# Patient Record
Sex: Male | Born: 1950 | Race: White | Hispanic: No | Marital: Married | Smoking: Never smoker
Health system: Southern US, Community
[De-identification: ages and names within clinical notes are randomized; demographics above are authoritative.]

## PROBLEM LIST (undated history)

## (undated) DIAGNOSIS — I10 Essential (primary) hypertension: Secondary | ICD-10-CM

## (undated) DIAGNOSIS — E785 Hyperlipidemia, unspecified: Secondary | ICD-10-CM

## (undated) DIAGNOSIS — H35039 Hypertensive retinopathy, unspecified eye: Secondary | ICD-10-CM

## (undated) HISTORY — PX: HERNIA REPAIR: SHX51

## (undated) HISTORY — DX: Essential (primary) hypertension: I10

## (undated) HISTORY — DX: Hyperlipidemia, unspecified: E78.5

## (undated) HISTORY — PX: EYE SURGERY: SHX253

## (undated) HISTORY — DX: Hypertensive retinopathy, unspecified eye: H35.039

## (undated) HISTORY — PX: CATARACT EXTRACTION: SUR2

## (undated) HISTORY — PX: RETINAL DETACHMENT SURGERY: SHX105

---
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
## 1997-03-04 HISTORY — PX: CATARACT EXTRACTION: SUR2

## 1998-10-04 ENCOUNTER — Ambulatory Visit: Admission: RE | Admit: 1998-10-04 | Discharge: 1998-10-04 | Payer: Self-pay | Admitting: Internal Medicine

## 2000-07-24 ENCOUNTER — Ambulatory Visit (HOSPITAL_COMMUNITY): Admission: RE | Admit: 2000-07-24 | Discharge: 2000-07-24 | Payer: Self-pay | Admitting: Internal Medicine

## 2003-02-08 ENCOUNTER — Encounter: Admission: RE | Admit: 2003-02-08 | Discharge: 2003-05-09 | Payer: Self-pay | Admitting: Family Medicine

## 2005-06-13 ENCOUNTER — Ambulatory Visit (HOSPITAL_COMMUNITY): Admission: RE | Admit: 2005-06-13 | Discharge: 2005-06-13 | Payer: Self-pay | Admitting: Internal Medicine

## 2005-11-12 ENCOUNTER — Ambulatory Visit (HOSPITAL_COMMUNITY): Admission: RE | Admit: 2005-11-12 | Discharge: 2005-11-13 | Payer: Self-pay | Admitting: Ophthalmology

## 2005-11-22 ENCOUNTER — Ambulatory Visit (HOSPITAL_COMMUNITY): Admission: RE | Admit: 2005-11-22 | Discharge: 2005-11-23 | Payer: Self-pay | Admitting: Ophthalmology

## 2010-02-15 ENCOUNTER — Ambulatory Visit: Payer: Self-pay | Admitting: Cardiology

## 2010-02-19 ENCOUNTER — Telehealth (INDEPENDENT_AMBULATORY_CARE_PROVIDER_SITE_OTHER): Payer: Self-pay | Admitting: *Deleted

## 2010-02-20 ENCOUNTER — Encounter (HOSPITAL_COMMUNITY)
Admission: RE | Admit: 2010-02-20 | Discharge: 2010-04-03 | Payer: Self-pay | Source: Home / Self Care | Attending: Cardiology | Admitting: Cardiology

## 2010-02-20 ENCOUNTER — Encounter: Payer: Self-pay | Admitting: Cardiology

## 2010-02-20 ENCOUNTER — Encounter: Payer: Self-pay | Admitting: *Deleted

## 2010-02-20 ENCOUNTER — Ambulatory Visit: Payer: Self-pay

## 2010-02-21 HISTORY — PX: CARDIOVASCULAR STRESS TEST: SHX262

## 2010-03-09 ENCOUNTER — Ambulatory Visit: Payer: Self-pay | Admitting: Cardiovascular Disease

## 2010-04-05 NOTE — Progress Notes (Signed)
Summary: nuc pre procedure  Phone Note Outgoing Call Call back at Home Phone [phone]   Call placed by: Cathlyn Parsons RN,  February 19, 2010 4:51 PM Call placed to: Patient Summary of Call: Left message with information on Myoview Information Sheet (see scanned document for details).      Nuclear Med Background Indications for Stress Test: Evaluation for Ischemia   History: Myocardial Infarction  History Comments: 09 MPS normal  Symptoms: Chest Pain  Symptoms Comments: Chest pain radiates back and L arm   Nuclear Pre-Procedure Cardiac Risk Factors: Family History - CAD, Hypertension, Lipids, NIDDM

## 2010-04-05 NOTE — Assessment & Plan Note (Signed)
Summary: Cardiology Nuclear Testing  Nuclear Med Background Indications for Stress Test: Evaluation for Ischemia   History: Myocardial Infarction  History Comments: 09 MPS normal  Symptoms: Chest Pain, DOE, Fatigue  Symptoms Comments: Chest pain radiates back and L arm   Nuclear Pre-Procedure Cardiac Risk Factors: Family History - CAD, Hypertension, Lipids, NIDDM Caffeine/Decaff Intake: none NPO After: 7:30 AM Lungs: clear IV 0.9% NS with Angio Cath: 22g     IV Site: R Hand IV Started by: Cathlyn Parsons, RN Chest Size (in) 44     Height (in): 67 Weight (lb): 195 BMI: 30.65  Nuclear Med Study 1 or 2 day study:  1 day     Stress Test Type:  Eugenie Birks Reading MD:  Cassell Clement, MD     Referring MD:  S.Tennant Resting Radionuclide:  Technetium 59m Tetrofosmin     Resting Radionuclide Dose:  11 mCi  Stress Radionuclide:  Technetium 96m Tetrofosmin     Stress Radionuclide Dose:  33 mCi   Stress Protocol  Max Systolic BP: 153 mm Hg Lexiscan: 0.4 mg   Stress Test Technologist:  Milana Na, EMT-P     Nuclear Technologist:  Doyne Keel, CNMT  Rest Procedure  Myocardial perfusion imaging was performed at rest 45 minutes following the intravenous administration of Technetium 63m Tetrofosmin.  Stress Procedure  The patient received IV Lexiscan 0.4 mg over 15-seconds.  Technetium 46m Tetrofosmin injected at 30-seconds.  There were no significant changes with infusion.  Quantitative spect images were obtained after a 45 minute delay.  QPS Raw Data Images:  Normal; no motion artifact; normal heart/lung ratio. Stress Images:  Normal homogeneous uptake in all areas of the myocardium. Rest Images:  Normal homogeneous uptake in all areas of the myocardium. Subtraction (SDS):  No evidence of ischemia. Transient Ischemic Dilatation:  .99  (Normal <1.22)  Lung/Heart Ratio:  .35  (Normal <0.45)  Quantitative Gated Spect Images QGS EDV:  100 ml QGS ESV:  35 ml QGS EF:  65  % QGS cine images:  Normal wall motion.  Findings Normal nuclear study      Overall Impression  Exercise Capacity: Lexiscan with no exercise. BP Response: Normal blood pressure response. Clinical Symptoms: No chest pain ECG Impression: No significant ST segment change suggestive of ischemia. Overall Impression: Normal stress nuclear study.  Appended Document: Cardiology Nuclear Testing copy sent to Dr. Deborah Chalk

## 2010-07-20 ENCOUNTER — Telehealth: Payer: Self-pay | Admitting: Cardiovascular Disease

## 2010-07-20 NOTE — Op Note (Signed)
NAME:  Anthony Brennan, Anthony Brennan NO.:  [id number]   MEDICAL RECORD NO.:  [id number]          PATIENT TYPE:  OIB   LOCATION:  5742                         FACILITY:  [facility]   PHYSICIAN:  Beulah Gandy. Ashley Royalty, M.D. DATE OF BIRTH:  1950-11-21   DATE OF PROCEDURE:  11/22/2005  DATE OF DISCHARGE:  11/23/2005                                 OPERATIVE REPORT   ADMISSION DIAGNOSES:  1. Ocular hyphema.  2. Vitreous hemorrhage.   POSTOPERATIVE DIAGNOSES:  1. Hyphema.  2. Vitreous hemorrhage.  3. Retinal detachment.  4. Subretinal hemorrhage.   PROCEDURES:  Pars plana vitrectomy, anterior chamber washout, removal of  subretinal hemorrhage, Perfluoron injection, Perfluoron removal, silicone  oil injection, membrane peel, iridectomy x2, in the left eye.   SURGEON:  Beulah Gandy. Ashley Royalty, M.D.   ASSISTANT:  Bryan Lemma. Lundquist, P.A.   ANESTHESIA:  General.   DETAILS:  Usual prep and drape, peritomies marked at 10, 2 and 4 o'clock.  The Lewicky infusion device was inserted into the cornea at 6 o'clock.  A  syringe was used to withdraw blood and fluid from the anterior chamber.  This maneuver was continued until the anterior chamber was clear.  The iris  details and the implant became apparent.  The 6-mm infusion port was  anchored into place at 4 o'clock.  The lighted pick and the cutter were  placed at 10 and 2 o'clock, respectively.  Provisc was placed on the corneal  surface.  Pars plana vitrectomy was performed carefully with removal of the  gas bubble just behind the pseudophakos.  Large clots of blood were  encountered and carefully removed, layer by layer, until retinal tissue was  seen.  The retina was thrown into folds, was gray, and had multiple large  breaks.  PVR membranes were attached to the retina.  These were stripped  with vitreous forceps and the lighted pick.  Subretinal clots were removed  with the vitreous cutter.  Perfluoron was injected sequentially to reattach  the retina while blood was removed from the subretinal space.  Anterior loop  traction was relieved with the vitreous cutter, forceps and the vitreous  pick.  Reattachment of the retina was obtained with full Perfluoron  injection.  A gas-fluid exchange was performed and removal of Perfluoron  with infusion of sterile gas.  A vitreous washout was performed with BSS and  all Perfluoron was removed.  Silicone oil was injected to a total fill.  Peripheral iridectomy at 12 o'clock was performed with the vitreous cutter  and at 6 o'clock with the MVR knife.  Gas was infused into the anterior  chamber to force silicone oil behind the pupillary plane.  The instruments  were removed from the eye and 9-0 nylon was used to close the sclerotomy  sites.  They were tested and found to be tight.  The conjunctiva was  reposited with 7-0 chromic suture.  A 10-0 nylon suture was placed in the  cornea at 5 o'clock.  The corneal wound was tight.  Closing pressure was 15  with a Barraquer tonometer.  Polymyxin and  gentamicin were irrigated into  Tenon's space.  Atropine solution was applied.  Marcaine was injected around  the globe for postop pain.  Decadron 10 mg was injected into the lower  subconjunctival space.  TobraDex ophthalmic ointment, a patch and shield  were placed.  The patient was awakened and taken to recovery in satisfactory  condition.   COMPLICATIONS:  None.   DURATION:  2-1/2 hours.      Beulah Gandy. Ashley Royalty, M.D.  Electronically Signed     JDM/MEDQ  D:  11/22/2005  T:  11/25/2005  Job:  [id number]

## 2010-07-20 NOTE — Telephone Encounter (Signed)
PT ASKING FOR SAMPLES OF ACTOS. CAN LEAVE MESG ON MACHINE AT HOME IF NEED TO. CHART IN BOX.

## 2010-07-20 NOTE — Telephone Encounter (Signed)
msg left, we have actos 30mg  one moths worth [id number] may 2013,Jodette [facility] RN

## 2010-07-20 NOTE — Op Note (Signed)
NAME:  MARTINE, TRAGESER NO.:  [id number]   MEDICAL RECORD NO.:  [id number]          PATIENT TYPE:  OIB   LOCATION:  5730                         FACILITY:  [facility]   PHYSICIAN:  Beulah Gandy. Ashley Royalty, M.D. DATE OF BIRTH:  1951-01-03   DATE OF PROCEDURE:  11/12/2005  DATE OF DISCHARGE:  11/13/2005                                 OPERATIVE REPORT   ADMISSION DIAGNOSIS:  Vitreous hemorrhage, proliferative diabetic  retinopathy left eye.  That is the preop diagnosis.   POSTOPERATIVE DIAGNOSIS:  Proliferative diabetic retinopathy, retinal  detachment, vitreous hemorrhage, choroidal hemorrhage, ocular hemorrhage, in  the left eye.   PROCEDURES:  Pars plana vitrectomy with membrane peel, retinal  photocoagulation, gas fluid exchange, scleral buckle, intraocular laser, use  of iris retractors.   SURGEON:  Alan Mulder, M.D.   ASSISTANT:  Adela Ports RN for the first portion and Alma Downs PA-C for  the last portion of the procedure.   ANESTHESIA:  General.   DETAILS:  Usual prep and drape, 25 gauge trocars placed at 10, 2 and 4  o'clock.  Provisc placed on the corneal surface.  A small pupil was seen.  The pars plana vitrectomy was begun just behind the pseudophakos.  Vitreous  and blood were encountered, carefully removed.  A large clump of blood was  seen in the lower half of the retina.  This blood was removed carefully and  slowly until white retina was seen.  As the blood was removed, a retinal  detachment was revealed.  AM small retinal horseshoe tear was seen at 7  o'clock at the equator causing the detachment.  The detachment extended from  4 o'clock to 9 o'clock and extended to the disk.  The vitrectomy continued  removing all vitreous and blood along with some vitreous membranes.  The  endolaser was positioned in the eye.  2781 burns were placed around the  retinal periphery in pan retinal fashion, power was between 1000 and 1500  milliwatts, 1000 microns  each and 0.1 seconds each.  The decision was made  at this point for scleral buckle. The 25 gauge trocars were removed and the  wounds were held until tight. Notification of the family was made.  At this  point a 360 degrees scleral dissection was carried out.  The rectus muscles  were imbricated with 4-0 silk sutures.  Diathermy was placed in the bed.  Additional posterior dissection was carried out at 7 o'clock to support the  retinal break.  The scleral buckle was inserted and scleral flaps were  closed with Mersilene suture.  The band was adjusted and trimmed.  The  buckle was adjusted and trimmed.  The sutures were knotted and trimmed.  25  gauge trocars were repositioned in the eye and vitrectomy was continued.  Perfluoron was infused into the vitreous cavity to reattach the retina.  The  endolaser was positioned in the eye and the completion of the pan retinal  photocoagulation was carried out in the quadrant where the detachment had  previously been and on the scleral buckle.  The break was  well supported on  the buckle.  At this time fresh blood was present in the vitreous cavity and  the view was reduced because of the walls of the eye coming together in  toward the vitreous cavity.  The infusion pressure was immediately increased  to 60 mmHg to stop this hemorrhage, however it continued.  Decision was then  made for 20 gauge vitrectomy a 6-mm infusion port was inserted and pressure  was maintained at 60, a sclerotomy point was made at 2 o'clock in order to  insert 20-gauge cutter and the 20-gauge instruments.  The vitreous cavity  was rinsed and rinsed.  Blood was covering the anterior portion of the  vitreous and the view became very poor as the pupil became small.  Iris  retractors were then used to retract the iris in a square fashion and the  wideness of the pupil improved, however the clarity of the view was still  poor because of continuous bleeding in front of and behind  the implant.  Multiple gas fluid exchange and BSS rinses were performed to remove blood.  However, as each layer of blood was removed, additional bleeding occurred.  This procedure continued for several hours.  Once it was determined that the  view could not be improved a subtotal gas fluid exchange was carried out  with Perfluoropropane 16%.  The instruments were carefully removed and 9-0  nylon was used to close the sclerotomy sites at 2 o'clock and 4 o'clock  where 20-gauge instruments were removed.  The Perfluoropropane was  supplemented in the vitreous cavity to obtain a closing pressure at 10 mmHg  with a Barraquer tonometer.  Scleral buckle was inspected.  The conjunctiva  was reposited with 7-0 chromic sutures.  The iris retractors were removed  and the wounds were found to be tight.  Polymyxin and gentamicin were  irrigated into Tenon's space.  Atropine solution was applied.  Marcaine was  injected around the globe for postop pain.  TobraDex ophthalmic ointment, a  patch and shield were placed.  The patient is awakened and taken to recovery  in satisfactory condition.      Beulah Gandy. Ashley Royalty, M.D.  Electronically Signed     JDM/MEDQ  D:  11/12/2005  T:  11/13/2005  Job:  [id number]

## 2010-09-19 ENCOUNTER — Ambulatory Visit: Payer: Self-pay | Admitting: Nurse Practitioner

## 2010-09-25 ENCOUNTER — Encounter: Payer: Self-pay | Admitting: Cardiovascular Disease

## 2010-09-26 ENCOUNTER — Encounter: Payer: Self-pay | Admitting: Cardiovascular Disease

## 2010-09-27 ENCOUNTER — Encounter: Payer: Self-pay | Admitting: Cardiovascular Disease

## 2010-09-27 ENCOUNTER — Ambulatory Visit (INDEPENDENT_AMBULATORY_CARE_PROVIDER_SITE_OTHER): Payer: BC Managed Care – PPO | Admitting: Cardiovascular Disease

## 2010-09-27 DIAGNOSIS — I10 Essential (primary) hypertension: Secondary | ICD-10-CM

## 2010-09-27 DIAGNOSIS — R079 Chest pain, unspecified: Secondary | ICD-10-CM

## 2010-09-27 DIAGNOSIS — E785 Hyperlipidemia, unspecified: Secondary | ICD-10-CM | POA: Insufficient documentation

## 2010-09-27 NOTE — Progress Notes (Signed)
Anthony Brennan Date of Birth  October 02, 1950 [facility] [address]     [address] [place], [place]  [address] [phone]  Fax  [phone]  History of Present Illness:  Anthony Brennan is a middle-aged gentleman with a history of hypertension and hyperlipidemia. He also has a history of diabetes mellitus. He's had some remote episodes of chest pain and has had a negative stress Myoview study in December of 2011. He's been on a good diet and has been going to [employer]. He's lost about 12 or 15 pounds since I last saw him.   Current Outpatient Prescriptions on File Prior to Visit  Medication Sig Dispense Refill  . aspirin 81 MG tablet Take 81 mg by mouth daily.        . Cholecalciferol (VITAMIN D PO) Take by mouth.        . fish oil-omega-3 fatty acids 1000 MG capsule Take 1,000 mg by mouth 4 (four) times daily.        . metFORMIN (GLUCOPHAGE) 500 MG tablet Take 500 mg by mouth daily.        . niacin 500 MG tablet Take 500 mg by mouth at bedtime.        . pioglitazone (ACTOS) 15 MG tablet Take 30 mg by mouth daily.        . ramipril (ALTACE) 10 MG tablet Take 10 mg by mouth daily.        . rosuvastatin (CRESTOR) 20 MG tablet Take 10 mg by mouth daily.         Allergies  Allergen Reactions  . Other     Steroid eyedrop    Past Medical History  Diagnosis Date  . Hypertension   . Hyperlipidemia   . Diabetes mellitus   . Chest pain     Recent episode of - stress Myoview study was normal    Past Surgical History  Procedure Date  . Cardiovascular stress test 02-21-2010    EF 65%  . Hernia repair 60 years old  . Appendectomy 1970  . Cataract extraction 1999    Left Eye    History  Smoking status  . Never Smoker   Smokeless tobacco  . Not on file    History  Alcohol Use  . Yes    Only Socially    Family History  Problem Relation Age of Onset  . Hypertension Mother   . Hyperlipidemia Brother     Reviw of Systems:  Reviewed  in the HPI.  All other systems are negative.  Physical Exam: BP 124/74  Pulse 80  Ht 5\' 7"  (1.702 m)  Wt 187 lb 3.2 oz (84.913 kg)  BMI 29.32 kg/m2 The patient is alert and oriented x 3.  The mood and affect are normal.   Skin: warm and dry.  Color is normal.    HEENT:   the sclera are nonicteric.  The mucous membranes are moist.  The carotids are 2+ without bruits.  There is no thyromegaly.  There is no JVD.    Lungs: clear.  The chest wall is non tender.    Heart: regular rate with a normal S1 and S2.  There are no murmurs, gallops, or rubs. The PMI is not displaced.     Abdomen: good bowel sounds.  There is no guarding or rebound.  There is no hepatosplenomegaly or tenderness.  There are no masses.   Extremities:  no clubbing, cyanosis, or edema.  The legs  are without rashes.  The distal pulses are intact.   Neuro:  Cranial nerves II - XII are intact.  Motor and sensory functions are intact.    The gait is normal.  ECG:  Assessment / Plan:

## 2010-09-27 NOTE — Assessment & Plan Note (Signed)
He has not had any recurrent episodes of chest pain. He had a Myoview study last year that was normal.

## 2010-09-27 NOTE — Assessment & Plan Note (Signed)
His blood pressure remains well controlled. We'll continue with the same medications.

## 2010-09-27 NOTE — Assessment & Plan Note (Signed)
Significant in 6 months. We'll check fasting lipid profile at that time.

## 2010-12-19 ENCOUNTER — Encounter (INDEPENDENT_AMBULATORY_CARE_PROVIDER_SITE_OTHER): Payer: BC Managed Care – PPO | Admitting: Ophthalmology

## 2010-12-19 DIAGNOSIS — H33309 Unspecified retinal break, unspecified eye: Secondary | ICD-10-CM

## 2010-12-19 DIAGNOSIS — H33009 Unspecified retinal detachment with retinal break, unspecified eye: Secondary | ICD-10-CM

## 2010-12-19 DIAGNOSIS — H18429 Band keratopathy, unspecified eye: Secondary | ICD-10-CM

## 2011-03-25 ENCOUNTER — Ambulatory Visit: Payer: BC Managed Care – PPO | Admitting: Cardiovascular Disease

## 2011-03-25 ENCOUNTER — Ambulatory Visit (INDEPENDENT_AMBULATORY_CARE_PROVIDER_SITE_OTHER): Payer: BC Managed Care – PPO | Admitting: Cardiovascular Disease

## 2011-03-25 ENCOUNTER — Encounter: Payer: Self-pay | Admitting: Cardiovascular Disease

## 2011-03-25 DIAGNOSIS — I10 Essential (primary) hypertension: Secondary | ICD-10-CM

## 2011-03-25 DIAGNOSIS — E785 Hyperlipidemia, unspecified: Secondary | ICD-10-CM

## 2011-03-25 DIAGNOSIS — R079 Chest pain, unspecified: Secondary | ICD-10-CM

## 2011-03-25 MED ORDER — RAMIPRIL 10 MG PO TABS: 10.0000 mg | ORAL_TABLET | Freq: Two times a day (BID) | ORAL | Status: DC

## 2011-03-25 MED ORDER — ROSUVASTATIN CALCIUM 20 MG PO TABS: 10.0000 mg | ORAL_TABLET | Freq: Every day | ORAL | Status: DC

## 2011-03-25 NOTE — Assessment & Plan Note (Signed)
Doing well.  Continue Altace

## 2011-03-25 NOTE — Assessment & Plan Note (Signed)
Will refill his Crestor.  Dr. Wylene Simmer will be checking lipids Wednesday.

## 2011-03-25 NOTE — Patient Instructions (Signed)
The current medical regimen is effective;  continue present plan and medications.  Follow up in 6 months with Dr Elease Hashimoto.  You will receive a letter in the mail 2 months before you are due.  Please call us when you receive this letter to schedule your follow up appointment.  Your physician recommends that you return for lab work in: 6 months for fasting lipid, hepatic panel and basic metabolic panel.

## 2011-03-25 NOTE — Progress Notes (Signed)
    Anthony Brennan Date of Birth  04-25-1950 [facility]     [place] Office  [address]    [address]   [address] [place], [place]  [address]    [place], [place]  [address] [phone]  Fax  [phone]  [phone]  Fax [phone]   History of Present Illness:  Anthony Brennan is a middle age gentleman with a hx of HTN and hyperlipidemia, DM.  He has done well .  Current Outpatient Prescriptions on File Prior to Visit  Medication Sig Dispense Refill  . aspirin 81 MG tablet Take 81 mg by mouth daily.        . Cholecalciferol (VITAMIN D PO) Take by mouth.        . fish oil-omega-3 fatty acids 1000 MG capsule Take 1,000 mg by mouth 4 (four) times daily.        . metFORMIN (GLUCOPHAGE) 500 MG tablet Take 500 mg by mouth daily.        Marland Kitchen moxifloxacin (VIGAMOX) 0.5 % ophthalmic solution Place 1 drop into the left eye 2 (two) times daily.        . niacin 500 MG tablet Take 500 mg by mouth 2 (two) times daily with a meal.       . pioglitazone (ACTOS) 15 MG tablet Take 30 mg by mouth daily.        . ramipril (ALTACE) 10 MG tablet Take 10 mg by mouth daily.        . rosuvastatin (CRESTOR) 20 MG tablet Take 10 mg by mouth daily.         Allergies  Allergen Reactions  . Other     Steroid eyedrop    Past Medical History  Diagnosis Date  . Hypertension   . Hyperlipidemia   . Diabetes mellitus   . Chest pain     Recent episode of - stress Myoview study was normal    Past Surgical History  Procedure Date  . Cardiovascular stress test 02-21-2010    EF 65%  . Hernia repair 61 years old  . Appendectomy 1970  . Cataract extraction 1999    Left Eye    History  Smoking status  . Never Smoker   Smokeless tobacco  . Not on file    History  Alcohol Use  . Yes    Only Socially    Family History  Problem Relation Age of Onset  . Hypertension Mother   . Hyperlipidemia Brother     Reviw of Systems:  Reviewed in the HPI.  All other systems are  negative.  Physical Exam: BP 139/75  Pulse 75  Ht 5\' 7"  (1.702 m)  Wt 181 lb 1.9 oz (82.155 kg)  BMI 28.37 kg/m2 The patient is alert and oriented x 3.  The mood and affect are normal.   Skin: warm and dry.  Color is normal.    HEENT:   His carotids are normal. There is no JVD. His neck is supple.  Lungs: Lungs are clear.   Heart: Regular rate S1-S2.    Abdomen: Has good bowel sounds.  Extremities:  No clubbing cyanosis or edema.  Neuro:  The exam is nonfocal.    ECG: Normal sinus rhythm. Normal EKG.  Assessment / Plan:

## 2011-10-03 ENCOUNTER — Encounter (INDEPENDENT_AMBULATORY_CARE_PROVIDER_SITE_OTHER): Payer: BC Managed Care – PPO | Admitting: Ophthalmology

## 2011-10-03 DIAGNOSIS — H33309 Unspecified retinal break, unspecified eye: Secondary | ICD-10-CM

## 2011-10-03 DIAGNOSIS — H33009 Unspecified retinal detachment with retinal break, unspecified eye: Secondary | ICD-10-CM

## 2011-10-03 DIAGNOSIS — H43819 Vitreous degeneration, unspecified eye: Secondary | ICD-10-CM

## 2012-03-30 ENCOUNTER — Encounter: Payer: Self-pay | Admitting: Cardiovascular Disease

## 2012-06-02 ENCOUNTER — Ambulatory Visit (INDEPENDENT_AMBULATORY_CARE_PROVIDER_SITE_OTHER): Payer: BC Managed Care – PPO | Admitting: Cardiovascular Disease

## 2012-06-02 ENCOUNTER — Encounter: Payer: Self-pay | Admitting: Cardiovascular Disease

## 2012-06-02 VITALS — BP 142/82 | HR 76 | Ht 67.0 in | Wt 208.0 lb

## 2012-06-02 DIAGNOSIS — E785 Hyperlipidemia, unspecified: Secondary | ICD-10-CM

## 2012-06-02 DIAGNOSIS — I10 Essential (primary) hypertension: Secondary | ICD-10-CM

## 2012-06-02 NOTE — Assessment & Plan Note (Signed)
Anthony Brennan has gained a little weight over the past year. He notes that he needs to get back to working out. At this point I don't think the facial is helping him so we will discontinue it. I've encouraged him to work out on a regular basis. I'll see him again in one year for followup visit.

## 2012-06-02 NOTE — Progress Notes (Signed)
    Anthony Brennan Date of Birth  Mar 24, 1950 [facility]     [place] Office  [address]    [address]   [address] [place], [place]  [address]    [place], [place]  [address] [phone]  Fax  [phone]  [phone]  Fax [phone]  Problem List: 1. HTN 2. Hyperlipidemia   History of Present Illness:  Anthony Brennan is a middle age gentleman with a hx of HTN and hyperlipidemia, DM.  He has done well .  June 02, 2012:  Anthony Brennan is doing well.  No CP or dyspnea.  Several dizzy spells yesterday - one episode occurred while he was sitting at his desk .  He has just started walking again this spring.    Current Outpatient Prescriptions on File Prior to Visit  Medication Sig Dispense Refill  . aspirin 81 MG tablet Take 81 mg by mouth daily.        . Cholecalciferol (VITAMIN D PO) Take by mouth.        . fish oil-omega-3 fatty acids 1000 MG capsule Take 1,000 mg by mouth 4 (four) times daily.        . metFORMIN (GLUCOPHAGE) 500 MG tablet Take 500 mg by mouth daily.        . pioglitazone (ACTOS) 15 MG tablet Take 30 mg by mouth daily.        . ramipril (ALTACE) 10 MG tablet Take 1 tablet (10 mg total) by mouth 2 (two) times daily.  180 tablet  3  . rosuvastatin (CRESTOR) 20 MG tablet Take 0.5 tablets (10 mg total) by mouth daily.  30 tablet  6   No current facility-administered medications on file prior to visit.    Allergies  Allergen Reactions  . Other     Steroid eyedrop    Past Medical History  Diagnosis Date  . Hypertension   . Hyperlipidemia   . Diabetes mellitus   . Chest pain     Recent episode of - stress Myoview study was normal    Past Surgical History  Procedure Laterality Date  . Cardiovascular stress test  02-21-2010    EF 65%  . Hernia repair  62 years old  . Appendectomy  1970  . Cataract extraction  1999    Left Eye    History  Smoking status  . Never Smoker   Smokeless tobacco  . Not on file    History  Alcohol Use  . Yes   Comment: Only Socially    Family History  Problem Relation Age of Onset  . Hypertension Mother   . Hyperlipidemia Brother     Reviw of Systems:  Reviewed in the HPI.  All other systems are negative.  Physical Exam: BP 142/82  Pulse 76  Ht 5\' 7"  (1.702 m)  Wt 208 lb (94.348 kg)  BMI 32.57 kg/m2 The patient is alert and oriented x 3.  The mood and affect are normal.   Skin: warm and dry.  Color is normal.    HEENT:   His carotids are normal. There is no JVD. His neck is supple.  Lungs: Lungs are clear.   Heart: Regular rate S1-S2.    Abdomen: Has good bowel sounds.  Extremities:  No clubbing cyanosis or edema.  2+ pulses   Neuro:  The exam is nonfocal.    ECG: June 02, 2012:  Normal sinus rhythm at 76. Normal EKG.  Assessment / Plan:

## 2012-06-02 NOTE — Patient Instructions (Addendum)
Your physician wants you to follow-up in: 1 YEAR  You will receive a reminder letter in the mail two months in advance. If you don't receive a letter, please call our office to schedule the follow-up appointment.   Your physician recommends that you continue on your current medications as directed. Please refer to the Current Medication list given to you today.   Your physician has recommended you make the following change in your medication:   STOP FISH OIL DUE TO INEFFECTIVENESS

## 2012-06-02 NOTE — Assessment & Plan Note (Signed)
Stable

## 2012-06-03 ENCOUNTER — Encounter: Payer: Self-pay | Admitting: Cardiovascular Disease

## 2012-06-03 ENCOUNTER — Encounter: Payer: Self-pay | Admitting: Cardiology

## 2012-07-02 ENCOUNTER — Ambulatory Visit (INDEPENDENT_AMBULATORY_CARE_PROVIDER_SITE_OTHER): Payer: BC Managed Care – PPO | Admitting: Ophthalmology

## 2012-07-08 ENCOUNTER — Ambulatory Visit (INDEPENDENT_AMBULATORY_CARE_PROVIDER_SITE_OTHER): Payer: BC Managed Care – PPO | Admitting: Ophthalmology

## 2012-07-08 DIAGNOSIS — H43819 Vitreous degeneration, unspecified eye: Secondary | ICD-10-CM

## 2012-07-08 DIAGNOSIS — E11319 Type 2 diabetes mellitus with unspecified diabetic retinopathy without macular edema: Secondary | ICD-10-CM

## 2012-07-08 DIAGNOSIS — H33009 Unspecified retinal detachment with retinal break, unspecified eye: Secondary | ICD-10-CM

## 2012-07-08 DIAGNOSIS — H33309 Unspecified retinal break, unspecified eye: Secondary | ICD-10-CM

## 2013-04-13 ENCOUNTER — Ambulatory Visit (INDEPENDENT_AMBULATORY_CARE_PROVIDER_SITE_OTHER): Payer: BC Managed Care – PPO | Admitting: Ophthalmology

## 2013-04-13 DIAGNOSIS — H35039 Hypertensive retinopathy, unspecified eye: Secondary | ICD-10-CM

## 2013-04-13 DIAGNOSIS — I10 Essential (primary) hypertension: Secondary | ICD-10-CM

## 2013-04-13 DIAGNOSIS — H33309 Unspecified retinal break, unspecified eye: Secondary | ICD-10-CM

## 2013-04-13 DIAGNOSIS — H43819 Vitreous degeneration, unspecified eye: Secondary | ICD-10-CM

## 2013-04-13 DIAGNOSIS — H33009 Unspecified retinal detachment with retinal break, unspecified eye: Secondary | ICD-10-CM

## 2013-06-30 ENCOUNTER — Ambulatory Visit (INDEPENDENT_AMBULATORY_CARE_PROVIDER_SITE_OTHER): Payer: BC Managed Care – PPO | Admitting: Cardiovascular Disease

## 2013-06-30 ENCOUNTER — Encounter: Payer: Self-pay | Admitting: Cardiovascular Disease

## 2013-06-30 VITALS — BP 144/72 | HR 92 | Ht 67.0 in | Wt 208.0 lb

## 2013-06-30 DIAGNOSIS — R0789 Other chest pain: Secondary | ICD-10-CM

## 2013-06-30 DIAGNOSIS — E785 Hyperlipidemia, unspecified: Secondary | ICD-10-CM

## 2013-06-30 DIAGNOSIS — I10 Essential (primary) hypertension: Secondary | ICD-10-CM

## 2013-06-30 DIAGNOSIS — E119 Type 2 diabetes mellitus without complications: Secondary | ICD-10-CM

## 2013-06-30 NOTE — Patient Instructions (Signed)
Your physician recommends that you continue on your current medications as directed. Please refer to the Current Medication list given to you today.  Your physician wants you to follow-up in: 1 year with Dr. Nahser.  You will receive a reminder letter in the mail two months in advance. If you don't receive a letter, please call our office to schedule the follow-up appointment.  

## 2013-06-30 NOTE — Assessment & Plan Note (Signed)
Anthony Brennan seems to be doing well. His blood pressures fairly well controlled. He's to get out and exercise more. Is really slacked off on his walking regimen this year. I've encouraged him to start a routine walking program. This will help his hypertension, hyperlipidemia, and his diabetes.   I'll see him in 1 year.    Dr. Osborne Casco manages his lipids.

## 2013-06-30 NOTE — Progress Notes (Signed)
Anthony Brennan Date of Birth  [birth year] [place]  [address]    [place]   [place], [place]  [address]    [place], [place]  [address] [phone]  Fax  [phone]  [phone]  Fax [phone]  Problem List: 1. HTN 2. Hyperlipidemia 3. Diabetes mellitus   History of Present Illness:  Anthony Brennan is a middle age gentleman with a hx of HTN and hyperlipidemia, DM.  He has done well .  June 02, 2012:  Anthony Brennan is doing well.  No CP or dyspnea.  Several dizzy spells yesterday - one episode occurred while he was sitting at his desk .  He has just started walking again this spring.    June 30, 2013:  Anthony Brennan is doing well.  No complaints.   No exercising much.  Gets winded on occasion when he exercises.  He gets his labs at Dr. Loren Racer office.  His trigs , HbA1C was elevated.     Current Outpatient Prescriptions on File Prior to Visit  Medication Sig Dispense Refill  . aspirin 81 MG tablet Take 81 mg by mouth daily.        . Cholecalciferol (VITAMIN D PO) Take by mouth.        . metFORMIN (GLUCOPHAGE) 500 MG tablet Take 500 mg by mouth daily.        . pioglitazone (ACTOS) 15 MG tablet Take 30 mg by mouth daily.        . ramipril (ALTACE) 10 MG tablet Take 1 tablet (10 mg total) by mouth 2 (two) times daily.  180 tablet  3  . rosuvastatin (CRESTOR) 20 MG tablet Take 0.5 tablets (10 mg total) by mouth daily.  30 tablet  6   No current facility-administered medications on file prior to visit.    Allergies  Allergen Reactions  . Other     Steroid eyedrop    Past Medical History  Diagnosis Date  . Hypertension   . Hyperlipidemia   . Diabetes mellitus   . Chest pain     Recent episode of - stress Myoview study was normal    Past Surgical History  Procedure Laterality Date  . Cardiovascular stress test  02-21-2010    EF 65%  . Hernia repair  63 years old  . Appendectomy  1970  . Cataract extraction  1999    Left Eye     History  Smoking status  . Never Smoker   Smokeless tobacco  . Not on file    History  Alcohol Use  . Yes    Comment: Only Socially    Family History  Problem Relation Age of Onset  . Hypertension Mother   . Hyperlipidemia Brother     Reviw of Systems:  Reviewed in the HPI.  All other systems are negative.  Physical Exam: BP 144/72  Pulse 92  Ht 5\' 7"  (1.702 m)  Wt 208 lb (94.348 kg)  BMI 32.57 kg/m2 The patient is alert and oriented x 3.  The mood and affect are normal.   Skin: warm and dry.  Color is normal.    HEENT:   His carotids are normal. There is no JVD. His neck is supple.  Lungs: Lungs are clear.   Heart: Regular rate S1-S2.    Abdomen: Has good bowel sounds.  Extremities:  No clubbing cyanosis or edema.  2+ pulses   Neuro:  The exam is nonfocal.  ECG: June 30, 2013:   NSR at 92. Normal ECG.   Assessment / Plan:

## 2014-01-11 ENCOUNTER — Ambulatory Visit (INDEPENDENT_AMBULATORY_CARE_PROVIDER_SITE_OTHER): Payer: BC Managed Care – PPO | Admitting: Ophthalmology

## 2014-01-11 DIAGNOSIS — H338 Other retinal detachments: Secondary | ICD-10-CM

## 2014-01-11 DIAGNOSIS — E11319 Type 2 diabetes mellitus with unspecified diabetic retinopathy without macular edema: Secondary | ICD-10-CM

## 2014-01-11 DIAGNOSIS — H35031 Hypertensive retinopathy, right eye: Secondary | ICD-10-CM

## 2014-01-11 DIAGNOSIS — E11329 Type 2 diabetes mellitus with mild nonproliferative diabetic retinopathy without macular edema: Secondary | ICD-10-CM

## 2014-01-11 DIAGNOSIS — H43811 Vitreous degeneration, right eye: Secondary | ICD-10-CM

## 2014-01-11 DIAGNOSIS — I10 Essential (primary) hypertension: Secondary | ICD-10-CM

## 2014-07-26 NOTE — Progress Notes (Signed)
Cardiology Office Note   Date:  07/26/2014   ID:  Anthony Brennan, DOB 1950/06/03, MRN [id number]  PCP:  Haywood Pao, MD  Cardiologist:   Thayer Headings, MD   Chief Complaint  Patient presents with  . Follow-up    htn   Problem List:   1. HTN 2. Hyperlipidemia   History of Present Illness:  Anthony Brennan is a middle age gentleman with a hx of HTN and hyperlipidemia, DM. He has done well .  June 02, 2012:  Anthony Brennan is doing well. No CP or dyspnea. Several dizzy spells yesterday - one episode occurred while he was sitting at his desk . He has just started walking again this spring.    Jul 27, 2014 : Anthony Brennan is a 64 y.o. male who presents for his HTN. Walking regularly.  No CP or dyspnea     Past Medical History  Diagnosis Date  . Hypertension   . Hyperlipidemia   . Diabetes mellitus   . Chest pain     Recent episode of - stress Myoview study was normal    Past Surgical History  Procedure Laterality Date  . Cardiovascular stress test  02-21-2010    EF 65%  . Hernia repair  64 years old  . Appendectomy  1970  . Cataract extraction  1999    Left Eye     Current Outpatient Prescriptions  Medication Sig Dispense Refill  . aspirin 81 MG tablet Take 81 mg by mouth daily.      . Cholecalciferol (VITAMIN D PO) Take by mouth.      . metFORMIN (GLUCOPHAGE) 500 MG tablet Take 500 mg by mouth daily.      . pioglitazone (ACTOS) 15 MG tablet Take 30 mg by mouth daily.      . ramipril (ALTACE) 10 MG tablet Take 1 tablet (10 mg total) by mouth 2 (two) times daily. 180 tablet 3  . rosuvastatin (CRESTOR) 20 MG tablet Take 0.5 tablets (10 mg total) by mouth daily. 30 tablet 6   No current facility-administered medications for this visit.    Allergies:   Other    Social History:  The patient  reports that he has never smoked. He does not have any smokeless tobacco history on file. He reports that he drinks alcohol.   Family History:  The patient's family history  includes Hyperlipidemia in his brother; Hypertension in his mother.    ROS:  Please see the history of present illness.    Review of Systems: Constitutional:  denies fever, chills, diaphoresis, appetite change and fatigue.  HEENT: denies photophobia, eye pain, redness, hearing loss, ear pain, congestion, sore throat, rhinorrhea, sneezing, neck pain, neck stiffness and tinnitus.  Respiratory: denies SOB, DOE, cough, chest tightness, and wheezing.  Cardiovascular: denies chest pain, palpitations and leg swelling.  Gastrointestinal: denies nausea, vomiting, abdominal pain, diarrhea, constipation, blood in stool.  Genitourinary: denies dysuria, urgency, frequency, hematuria, flank pain and difficulty urinating.  Musculoskeletal: denies  myalgias, back pain, joint swelling, arthralgias and gait problem.   Skin: denies pallor, rash and wound.  Neurological: denies dizziness, seizures, syncope, weakness, light-headedness, numbness and headaches.   Hematological: denies adenopathy, easy bruising, personal or family bleeding history.  Psychiatric/ Behavioral: denies suicidal ideation, mood changes, confusion, nervousness, sleep disturbance and agitation.       All other systems are reviewed and negative.    PHYSICAL EXAM: VS:  There were no vitals taken for this visit. , BMI There is no weight  on file to calculate BMI. GEN: Well nourished, well developed, in no acute distress HEENT: normal Neck: no JVD, carotid bruits, or masses Cardiac: RRR; no murmurs, rubs, or gallops,no edema  Respiratory:  clear to auscultation bilaterally, normal work of breathing GI: soft, nontender, nondistended, + BS MS: no deformity or atrophy Skin: warm and dry, no rash Neuro:  Strength and sensation are intact Psych: normal   EKG:  EKG is ordered today. The ekg ordered today demonstrates :  NSR at 72.  No ST or T wave changes.    Recent Labs: No results found for requested labs within last 365 days.     Lipid Panel No results found for: CHOL, TRIG, HDL, CHOLHDL, VLDL, LDLCALC, LDLDIRECT    Wt Readings from Last 3 Encounters:  06/30/13 94.348 kg (208 lb)  06/02/12 94.348 kg (208 lb)  03/25/11 82.155 kg (181 lb 1.9 oz)      Other studies Reviewed: Additional studies/ records that were reviewed today include: . Review of the above records demonstrates:    ASSESSMENT AND PLAN:  1. HTN- BP is well controlledl.   2. Hyperlipidemia - managed by Dr. Osborne Casco.  , well controlled. Continue crestor    Current medicines are reviewed at length with the patient today.  The patient does not have concerns regarding medicines.  The following changes have been made:  no change  Labs/ tests ordered today include:  No orders of the defined types were placed in this encounter.     Disposition:   FU with me in 1 year      Kenan Moodie, Wonda Cheng, MD  07/26/2014 6:12 PM    [facility] [place], [place], [place]  [address] Phone: [phone]; Fax: [phone]   [facility]  [address] [place], [place]  [address] [phone]    Fax [phone]

## 2014-07-27 ENCOUNTER — Encounter: Payer: Self-pay | Admitting: Cardiovascular Disease

## 2014-07-27 ENCOUNTER — Ambulatory Visit (INDEPENDENT_AMBULATORY_CARE_PROVIDER_SITE_OTHER): Payer: BLUE CROSS/BLUE SHIELD | Admitting: Cardiovascular Disease

## 2014-07-27 VITALS — BP 142/74 | HR 72 | Ht 67.0 in | Wt 181.4 lb

## 2014-07-27 DIAGNOSIS — E785 Hyperlipidemia, unspecified: Secondary | ICD-10-CM | POA: Diagnosis not present

## 2014-07-27 DIAGNOSIS — I10 Essential (primary) hypertension: Secondary | ICD-10-CM | POA: Diagnosis not present

## 2014-07-27 NOTE — Patient Instructions (Signed)
Medication Instructions:  Your physician recommends that you continue on your current medications as directed. Please refer to the Current Medication list given to you today.   Labwork: None Ordered   Testing/Procedures: None Ordered   Follow-Up: Your physician wants you to follow-up in: 1 year with Dr. Nahser.  You will receive a reminder letter in the mail two months in advance. If you don't receive a letter, please call our office to schedule the follow-up appointment.      

## 2014-10-26 ENCOUNTER — Ambulatory Visit (INDEPENDENT_AMBULATORY_CARE_PROVIDER_SITE_OTHER): Payer: BLUE CROSS/BLUE SHIELD | Admitting: Ophthalmology

## 2014-10-26 DIAGNOSIS — H338 Other retinal detachments: Secondary | ICD-10-CM

## 2014-10-26 DIAGNOSIS — H33301 Unspecified retinal break, right eye: Secondary | ICD-10-CM | POA: Diagnosis not present

## 2014-10-26 DIAGNOSIS — H43811 Vitreous degeneration, right eye: Secondary | ICD-10-CM

## 2014-10-26 DIAGNOSIS — H35031 Hypertensive retinopathy, right eye: Secondary | ICD-10-CM

## 2014-10-26 DIAGNOSIS — I10 Essential (primary) hypertension: Secondary | ICD-10-CM

## 2015-07-05 ENCOUNTER — Ambulatory Visit (INDEPENDENT_AMBULATORY_CARE_PROVIDER_SITE_OTHER): Payer: BLUE CROSS/BLUE SHIELD | Admitting: Ophthalmology

## 2015-07-05 DIAGNOSIS — H338 Other retinal detachments: Secondary | ICD-10-CM | POA: Diagnosis not present

## 2015-07-05 DIAGNOSIS — H33301 Unspecified retinal break, right eye: Secondary | ICD-10-CM

## 2015-07-05 DIAGNOSIS — I10 Essential (primary) hypertension: Secondary | ICD-10-CM

## 2015-07-05 DIAGNOSIS — H43811 Vitreous degeneration, right eye: Secondary | ICD-10-CM

## 2015-07-05 DIAGNOSIS — H35033 Hypertensive retinopathy, bilateral: Secondary | ICD-10-CM | POA: Diagnosis not present

## 2015-07-19 ENCOUNTER — Ambulatory Visit (INDEPENDENT_AMBULATORY_CARE_PROVIDER_SITE_OTHER): Payer: BLUE CROSS/BLUE SHIELD | Admitting: Cardiovascular Disease

## 2015-07-19 ENCOUNTER — Encounter: Payer: Self-pay | Admitting: Cardiovascular Disease

## 2015-07-19 VITALS — BP 154/80 | HR 73 | Ht 67.0 in | Wt 191.0 lb

## 2015-07-19 DIAGNOSIS — E785 Hyperlipidemia, unspecified: Secondary | ICD-10-CM | POA: Diagnosis not present

## 2015-07-19 DIAGNOSIS — I10 Essential (primary) hypertension: Secondary | ICD-10-CM

## 2015-07-19 NOTE — Patient Instructions (Signed)

## 2015-07-19 NOTE — Progress Notes (Signed)
Cardiology Office Note   Date:  07/19/2015   ID:  Anthony Brennan, DOB [birth year], MRN [id number]  PCP:  Haywood Pao, MD  Cardiologist:   Mertie Moores, MD   Chief Complaint  Patient presents with  . Hyperlipidemia   Problem List:   1. HTN 2. Hyperlipidemia 3. DM   History of Present Illness:  Anthony Brennan is a middle age gentleman with a hx of HTN and hyperlipidemia, DM. He has done well .  June 02, 2012:  Anthony Brennan is doing well. No CP or dyspnea. Several dizzy spells yesterday - one episode occurred while he was sitting at his desk . He has just started walking again this spring.    Jul 27, 2014 : Anthony Brennan is a 65 y.o. male who presents for his HTN. Walking regularly.  No CP or dyspnea   Jul 19, 2015:  Doing well.    BP is regular at home .    Past Medical History  Diagnosis Date  . Hypertension   . Hyperlipidemia   . Diabetes mellitus   . Chest pain     Recent episode of - stress Myoview study was normal    Past Surgical History  Procedure Laterality Date  . Cardiovascular stress test  02-21-2010    EF 65%  . Hernia repair  65 years old  . Appendectomy  1970  . Cataract extraction  1999    Left Eye     Current Outpatient Prescriptions  Medication Sig Dispense Refill  . aspirin 81 MG tablet Take 81 mg by mouth daily.      . Cholecalciferol (VITAMIN D PO) Take by mouth.      . metFORMIN (GLUCOPHAGE) 500 MG tablet Take 500 mg by mouth daily.      . pioglitazone (ACTOS) 15 MG tablet Take 30 mg by mouth daily.      . ramipril (ALTACE) 10 MG tablet Take 1 tablet (10 mg total) by mouth 2 (two) times daily. 180 tablet 3  . rosuvastatin (CRESTOR) 20 MG tablet Take 20 mg by mouth daily.     No current facility-administered medications for this visit.    Allergies:   Other    Social History:  The patient  reports that he has never smoked. He does not have any smokeless tobacco history on file. He reports that he drinks alcohol.   Family History:   The patient's family history includes Hyperlipidemia in his brother; Hypertension in his mother.    ROS:  Please see the history of present illness.    Review of Systems: Constitutional:  denies fever, chills, diaphoresis, appetite change and fatigue.  HEENT: denies photophobia, eye pain, redness, hearing loss, ear pain, congestion, sore throat, rhinorrhea, sneezing, neck pain, neck stiffness and tinnitus.  Respiratory: denies SOB, DOE, cough, chest tightness, and wheezing.  Cardiovascular: denies chest pain, palpitations and leg swelling.  Gastrointestinal: denies nausea, vomiting, abdominal pain, diarrhea, constipation, blood in stool.  Genitourinary: denies dysuria, urgency, frequency, hematuria, flank pain and difficulty urinating.  Musculoskeletal: denies  myalgias, back pain, joint swelling, arthralgias and gait problem.   Skin: denies pallor, rash and wound.  Neurological: denies dizziness, seizures, syncope, weakness, light-headedness, numbness and headaches.   Hematological: denies adenopathy, easy bruising, personal or family bleeding history.  Psychiatric/ Behavioral: denies suicidal ideation, mood changes, confusion, nervousness, sleep disturbance and agitation.       All other systems are reviewed and negative.    PHYSICAL EXAM: VS:  BP 154/80 mmHg  Pulse 73  Ht 5\' 7"  (1.702 m)  Wt 191 lb (86.637 kg)  BMI 29.91 kg/m2 , BMI Body mass index is 29.91 kg/(m^2). GEN: Well nourished, well developed, in no acute distress HEENT: normal Neck: no JVD, carotid bruits, or masses Cardiac: RRR; no murmurs, rubs, or gallops,no edema  Respiratory:  clear to auscultation bilaterally, normal work of breathing GI: soft, nontender, nondistended, + BS MS: no deformity or atrophy Skin: warm and dry, no rash Neuro:  Strength and sensation are intact Psych: normal   EKG:  EKG is ordered today. The ekg ordered today demonstrates :  NSR at 73.  No ST or T wave changes.    Recent  Labs: No results found for requested labs within last 365 days.    Lipid Panel No results found for: CHOL, TRIG, HDL, CHOLHDL, VLDL, LDLCALC, LDLDIRECT    Wt Readings from Last 3 Encounters:  07/19/15 191 lb (86.637 kg)  07/27/14 181 lb 6.4 oz (82.283 kg)  06/30/13 208 lb (94.348 kg)      Other studies Reviewed: Additional studies/ records that were reviewed today include: . Review of the above records demonstrates:    ASSESSMENT AND PLAN:  1. HTN- BP is well controlled at home. A bit elevated here.  Ive asked him to call me if his BP stays up .  Will see him in 1 year    2. Hyperlipidemia - managed by Dr. Osborne Casco.  , well controlled. Continue crestor   Current medicines are reviewed at length with the patient today.  The patient does not have concerns regarding medicines.  The following changes have been made:  no change  Labs/ tests ordered today include:  No orders of the defined types were placed in this encounter.     Disposition:   FU with me in 1 year      Mertie Moores, MD  07/19/2015 9:05 AM    [place], [place], [place]  [address] Phone: [phone]; Fax: [phone]   [facility]  [address] [place], [place]  [address] [phone]    Fax [phone]

## 2016-04-10 ENCOUNTER — Ambulatory Visit (INDEPENDENT_AMBULATORY_CARE_PROVIDER_SITE_OTHER): Payer: Medicare Other | Admitting: Ophthalmology

## 2016-04-10 DIAGNOSIS — H33301 Unspecified retinal break, right eye: Secondary | ICD-10-CM

## 2016-04-10 DIAGNOSIS — H35033 Hypertensive retinopathy, bilateral: Secondary | ICD-10-CM | POA: Diagnosis not present

## 2016-04-10 DIAGNOSIS — H43811 Vitreous degeneration, right eye: Secondary | ICD-10-CM | POA: Diagnosis not present

## 2016-04-10 DIAGNOSIS — I10 Essential (primary) hypertension: Secondary | ICD-10-CM

## 2016-04-10 DIAGNOSIS — H338 Other retinal detachments: Secondary | ICD-10-CM

## 2016-05-15 DIAGNOSIS — Z125 Encounter for screening for malignant neoplasm of prostate: Secondary | ICD-10-CM | POA: Diagnosis not present

## 2016-05-15 DIAGNOSIS — E559 Vitamin D deficiency, unspecified: Secondary | ICD-10-CM | POA: Diagnosis not present

## 2016-05-15 DIAGNOSIS — E119 Type 2 diabetes mellitus without complications: Secondary | ICD-10-CM | POA: Diagnosis not present

## 2016-05-15 DIAGNOSIS — I10 Essential (primary) hypertension: Secondary | ICD-10-CM | POA: Diagnosis not present

## 2016-05-22 DIAGNOSIS — I10 Essential (primary) hypertension: Secondary | ICD-10-CM | POA: Diagnosis not present

## 2016-05-22 DIAGNOSIS — G250 Essential tremor: Secondary | ICD-10-CM | POA: Diagnosis not present

## 2016-05-22 DIAGNOSIS — Z0000 Encounter for general adult medical examination without abnormal findings: Secondary | ICD-10-CM | POA: Diagnosis not present

## 2016-05-22 DIAGNOSIS — E559 Vitamin D deficiency, unspecified: Secondary | ICD-10-CM | POA: Diagnosis not present

## 2016-05-22 DIAGNOSIS — Z6833 Body mass index (BMI) 33.0-33.9, adult: Secondary | ICD-10-CM | POA: Diagnosis not present

## 2016-05-22 DIAGNOSIS — Z5181 Encounter for therapeutic drug level monitoring: Secondary | ICD-10-CM | POA: Diagnosis not present

## 2016-05-22 DIAGNOSIS — Z23 Encounter for immunization: Secondary | ICD-10-CM | POA: Diagnosis not present

## 2016-05-22 DIAGNOSIS — E7800 Pure hypercholesterolemia, unspecified: Secondary | ICD-10-CM | POA: Diagnosis not present

## 2016-05-22 DIAGNOSIS — E663 Overweight: Secondary | ICD-10-CM | POA: Diagnosis not present

## 2016-05-22 DIAGNOSIS — E119 Type 2 diabetes mellitus without complications: Secondary | ICD-10-CM | POA: Diagnosis not present

## 2016-05-22 DIAGNOSIS — Z1389 Encounter for screening for other disorder: Secondary | ICD-10-CM | POA: Diagnosis not present

## 2016-05-22 DIAGNOSIS — M19049 Primary osteoarthritis, unspecified hand: Secondary | ICD-10-CM | POA: Diagnosis not present

## 2016-05-23 DIAGNOSIS — Z1212 Encounter for screening for malignant neoplasm of rectum: Secondary | ICD-10-CM | POA: Diagnosis not present

## 2016-06-12 DIAGNOSIS — H18832 Recurrent erosion of cornea, left eye: Secondary | ICD-10-CM | POA: Diagnosis not present

## 2016-08-21 DIAGNOSIS — I10 Essential (primary) hypertension: Secondary | ICD-10-CM | POA: Diagnosis not present

## 2016-08-21 DIAGNOSIS — E559 Vitamin D deficiency, unspecified: Secondary | ICD-10-CM | POA: Diagnosis not present

## 2016-08-21 DIAGNOSIS — Z6831 Body mass index (BMI) 31.0-31.9, adult: Secondary | ICD-10-CM | POA: Diagnosis not present

## 2016-08-21 DIAGNOSIS — Z5181 Encounter for therapeutic drug level monitoring: Secondary | ICD-10-CM | POA: Diagnosis not present

## 2016-08-21 DIAGNOSIS — H8113 Benign paroxysmal vertigo, bilateral: Secondary | ICD-10-CM | POA: Diagnosis not present

## 2016-08-21 DIAGNOSIS — E119 Type 2 diabetes mellitus without complications: Secondary | ICD-10-CM | POA: Diagnosis not present

## 2016-08-21 DIAGNOSIS — H35033 Hypertensive retinopathy, bilateral: Secondary | ICD-10-CM | POA: Diagnosis not present

## 2016-08-21 DIAGNOSIS — E7800 Pure hypercholesterolemia, unspecified: Secondary | ICD-10-CM | POA: Diagnosis not present

## 2016-08-21 DIAGNOSIS — E663 Overweight: Secondary | ICD-10-CM | POA: Diagnosis not present

## 2016-08-21 DIAGNOSIS — G250 Essential tremor: Secondary | ICD-10-CM | POA: Diagnosis not present

## 2016-08-21 DIAGNOSIS — M19049 Primary osteoarthritis, unspecified hand: Secondary | ICD-10-CM | POA: Diagnosis not present

## 2016-12-04 DIAGNOSIS — I10 Essential (primary) hypertension: Secondary | ICD-10-CM | POA: Diagnosis not present

## 2016-12-04 DIAGNOSIS — E663 Overweight: Secondary | ICD-10-CM | POA: Diagnosis not present

## 2016-12-04 DIAGNOSIS — E559 Vitamin D deficiency, unspecified: Secondary | ICD-10-CM | POA: Diagnosis not present

## 2016-12-04 DIAGNOSIS — Z23 Encounter for immunization: Secondary | ICD-10-CM | POA: Diagnosis not present

## 2016-12-04 DIAGNOSIS — Z6831 Body mass index (BMI) 31.0-31.9, adult: Secondary | ICD-10-CM | POA: Diagnosis not present

## 2016-12-04 DIAGNOSIS — H35033 Hypertensive retinopathy, bilateral: Secondary | ICD-10-CM | POA: Diagnosis not present

## 2016-12-04 DIAGNOSIS — M19049 Primary osteoarthritis, unspecified hand: Secondary | ICD-10-CM | POA: Diagnosis not present

## 2016-12-04 DIAGNOSIS — E119 Type 2 diabetes mellitus without complications: Secondary | ICD-10-CM | POA: Diagnosis not present

## 2016-12-04 DIAGNOSIS — G250 Essential tremor: Secondary | ICD-10-CM | POA: Diagnosis not present

## 2016-12-04 DIAGNOSIS — E7800 Pure hypercholesterolemia, unspecified: Secondary | ICD-10-CM | POA: Diagnosis not present

## 2017-01-08 ENCOUNTER — Ambulatory Visit (INDEPENDENT_AMBULATORY_CARE_PROVIDER_SITE_OTHER): Payer: Medicare Other | Admitting: Ophthalmology

## 2017-01-08 DIAGNOSIS — H338 Other retinal detachments: Secondary | ICD-10-CM

## 2017-01-08 DIAGNOSIS — H43811 Vitreous degeneration, right eye: Secondary | ICD-10-CM

## 2017-01-08 DIAGNOSIS — H33301 Unspecified retinal break, right eye: Secondary | ICD-10-CM | POA: Diagnosis not present

## 2017-01-22 DIAGNOSIS — I10 Essential (primary) hypertension: Secondary | ICD-10-CM | POA: Diagnosis not present

## 2017-01-22 DIAGNOSIS — E119 Type 2 diabetes mellitus without complications: Secondary | ICD-10-CM | POA: Diagnosis not present

## 2017-01-22 DIAGNOSIS — Z6831 Body mass index (BMI) 31.0-31.9, adult: Secondary | ICD-10-CM | POA: Diagnosis not present

## 2017-03-19 DIAGNOSIS — I10 Essential (primary) hypertension: Secondary | ICD-10-CM | POA: Diagnosis not present

## 2017-03-19 DIAGNOSIS — E119 Type 2 diabetes mellitus without complications: Secondary | ICD-10-CM | POA: Diagnosis not present

## 2017-03-19 DIAGNOSIS — Z6831 Body mass index (BMI) 31.0-31.9, adult: Secondary | ICD-10-CM | POA: Diagnosis not present

## 2017-04-16 DIAGNOSIS — R300 Dysuria: Secondary | ICD-10-CM | POA: Diagnosis not present

## 2017-05-07 DIAGNOSIS — Z961 Presence of intraocular lens: Secondary | ICD-10-CM | POA: Diagnosis not present

## 2017-05-07 DIAGNOSIS — H18832 Recurrent erosion of cornea, left eye: Secondary | ICD-10-CM | POA: Diagnosis not present

## 2017-05-28 ENCOUNTER — Encounter: Payer: Self-pay | Admitting: Cardiovascular Disease

## 2017-05-28 DIAGNOSIS — R82998 Other abnormal findings in urine: Secondary | ICD-10-CM | POA: Diagnosis not present

## 2017-05-28 DIAGNOSIS — Z125 Encounter for screening for malignant neoplasm of prostate: Secondary | ICD-10-CM | POA: Diagnosis not present

## 2017-05-28 DIAGNOSIS — I10 Essential (primary) hypertension: Secondary | ICD-10-CM | POA: Diagnosis not present

## 2017-05-28 DIAGNOSIS — E559 Vitamin D deficiency, unspecified: Secondary | ICD-10-CM | POA: Diagnosis not present

## 2017-05-28 DIAGNOSIS — E119 Type 2 diabetes mellitus without complications: Secondary | ICD-10-CM | POA: Diagnosis not present

## 2017-06-04 DIAGNOSIS — E663 Overweight: Secondary | ICD-10-CM | POA: Diagnosis not present

## 2017-06-04 DIAGNOSIS — M19049 Primary osteoarthritis, unspecified hand: Secondary | ICD-10-CM | POA: Diagnosis not present

## 2017-06-04 DIAGNOSIS — Z6828 Body mass index (BMI) 28.0-28.9, adult: Secondary | ICD-10-CM | POA: Diagnosis not present

## 2017-06-04 DIAGNOSIS — I10 Essential (primary) hypertension: Secondary | ICD-10-CM | POA: Diagnosis not present

## 2017-06-04 DIAGNOSIS — E119 Type 2 diabetes mellitus without complications: Secondary | ICD-10-CM | POA: Diagnosis not present

## 2017-06-04 DIAGNOSIS — Z23 Encounter for immunization: Secondary | ICD-10-CM | POA: Diagnosis not present

## 2017-06-04 DIAGNOSIS — E7800 Pure hypercholesterolemia, unspecified: Secondary | ICD-10-CM | POA: Diagnosis not present

## 2017-06-04 DIAGNOSIS — Z5181 Encounter for therapeutic drug level monitoring: Secondary | ICD-10-CM | POA: Diagnosis not present

## 2017-06-04 DIAGNOSIS — Z1389 Encounter for screening for other disorder: Secondary | ICD-10-CM | POA: Diagnosis not present

## 2017-06-04 DIAGNOSIS — Z0000 Encounter for general adult medical examination without abnormal findings: Secondary | ICD-10-CM | POA: Diagnosis not present

## 2017-06-04 DIAGNOSIS — H35033 Hypertensive retinopathy, bilateral: Secondary | ICD-10-CM | POA: Diagnosis not present

## 2017-06-04 DIAGNOSIS — G250 Essential tremor: Secondary | ICD-10-CM | POA: Diagnosis not present

## 2017-06-06 DIAGNOSIS — Z1212 Encounter for screening for malignant neoplasm of rectum: Secondary | ICD-10-CM | POA: Diagnosis not present

## 2017-08-14 ENCOUNTER — Encounter: Payer: Self-pay | Admitting: Cardiovascular Disease

## 2017-08-14 ENCOUNTER — Ambulatory Visit (INDEPENDENT_AMBULATORY_CARE_PROVIDER_SITE_OTHER): Payer: Medicare Other | Admitting: Cardiovascular Disease

## 2017-08-14 VITALS — BP 138/72 | HR 79 | Ht 67.0 in | Wt 169.0 lb

## 2017-08-14 DIAGNOSIS — I10 Essential (primary) hypertension: Secondary | ICD-10-CM | POA: Diagnosis not present

## 2017-08-14 DIAGNOSIS — E782 Mixed hyperlipidemia: Secondary | ICD-10-CM | POA: Diagnosis not present

## 2017-08-14 NOTE — Patient Instructions (Signed)
Medication Instructions:  Your physician recommends that you continue on your current medications as directed. Please refer to the Current Medication list given to you today.   Labwork: None Ordered   Testing/Procedures: None Ordered   Follow-Up: Your physician wants you to follow-up in: 2 years with Dr. Nahser.  You will receive a reminder letter in the mail two months in advance. If you don't receive a letter, please call our office to schedule the follow-up appointment.   If you need a refill on your cardiac medications before your next appointment, please call your pharmacy.   Thank you for choosing [facility]! Michelle Swinyer, RN [phone]    

## 2017-08-14 NOTE — Progress Notes (Signed)
Cardiology Office Note   Date:  08/14/2017   ID:  Anthony Brennan, DOB 1951/01/17, MRN [id number]  PCP:  Haywood Pao, MD  Cardiologist:   Mertie Moores, MD   Chief Complaint  Patient presents with  . Hypertension  . Hyperlipidemia   Problem List:   1. HTN 2. Hyperlipidemia 3. DM     Anthony Brennan is a middle age gentleman with a hx of HTN and hyperlipidemia, DM. He has done well .  June 02, 2012:  Anthony Brennan is doing well. No CP or dyspnea. Several dizzy spells yesterday - one episode occurred while he was sitting at his desk . He has just started walking again this spring.    Jul 27, 2014 : Anthony Brennan is a 67 y.o. male who presents for his HTN. Walking regularly.  No CP or dyspnea   Jul 19, 2015:  Doing well.    BP is regular at home .  August 14, 2017:  Anthony Brennan is seen back today for follow-up visit.  He has a history of hypertension and hyperlipidemia.  He was last seen 2 years ago. Has had some bruising  Exercising some.    Still working in the [employer].   Had labs drawn at [facility]. Glucose was mildly elevated at 112.  Creatinine is normal at 0.8.  Sodium and potassium are normal.  Liver enzymes are normal.  White blood cell count is normal.  Apoprotein B is normal.  PSA was 1.634.  HDL is 51.  LDL 76.  Total cholesterol is 145.  Triglyceride level is 88.  Past Medical History:  Diagnosis Date  . Chest pain    Recent episode of - stress Myoview study was normal  . Diabetes mellitus   . Hyperlipidemia   . Hypertension     Past Surgical History:  Procedure Laterality Date  . APPENDECTOMY  1970  . CARDIOVASCULAR STRESS TEST  02-21-2010   EF 65%  . CATARACT EXTRACTION  1999   Left Eye  . HERNIA REPAIR  67 years old     Current Outpatient Medications  Medication Sig Dispense Refill  . aspirin 81 MG tablet Take 81 mg by mouth daily.      . Cholecalciferol (VITAMIN D PO) Take 1 capsule by mouth daily.     . Dulaglutide  (TRULICITY) 1.51 VO/1.6WV SOPN Inject 1 Dose into the skin once a week.    . empagliflozin (JARDIANCE) 25 MG TABS tablet Take 25 mg by mouth daily.    . metFORMIN (GLUCOPHAGE) 500 MG tablet Take 500 mg by mouth daily.      . pioglitazone (ACTOS) 15 MG tablet Take 30 mg by mouth daily.      . ramipril (ALTACE) 10 MG tablet Take 1 tablet (10 mg total) by mouth 2 (two) times daily. 180 tablet 3  . rosuvastatin (CRESTOR) 40 MG tablet Take 1 tablet by mouth daily.  3   No current facility-administered medications for this visit.     Allergies:   Other    Social History:  The patient  reports that he has never smoked. He has never used smokeless tobacco. He reports that he drinks alcohol.   Family History:  The patient's family history includes Hyperlipidemia in his brother; Hypertension in his mother.    ROS:    Reviewed and current history, otherwise review of systems is negative.   Physical Exam: Blood pressure 138/72, pulse 79, height 5\' 7"  (1.702 m), weight 169 lb (76.7  kg), SpO2 96 %.  GEN:  Well nourished, well developed in no acute distress HEENT: Normal NECK: No JVD; No carotid bruits LYMPHATICS: No lymphadenopathy CARDIAC: RR,  RESPIRATORY:  Clear to auscultation without rales, wheezing or rhonchi  ABDOMEN: Soft, non-tender, non-distended MUSCULOSKELETAL:  No edema; No deformity  SKIN: Warm and dry NEUROLOGIC:  Alert and oriented x 3   EKG:    August 14, 2017 Normal sinus rhythm at 79.  Minimal criteria for left ventricular hypertrophy.   Recent Labs: No results found for requested labs within last 8760 hours.    Lipid Panel No results found for: CHOL, TRIG, HDL, CHOLHDL, VLDL, LDLCALC, LDLDIRECT    Wt Readings from Last 3 Encounters:  08/14/17 169 lb (76.7 kg)  07/19/15 191 lb (86.6 kg)  07/27/14 181 lb 6.4 oz (82.3 kg)      Other studies Reviewed: Additional studies/ records that were reviewed today include: . Review of the above records demonstrates:     ASSESSMENT AND PLAN:  1. HTN-    blood pressure is well controlled.  Continue current medications.  2. Hyperlipidemia -his lipids are well controlled.  Continue Crestor.  This is managed by his primary medical doctor.  He brought labs with him from April and his labs look great.  His glucose is borderline high.  His triglyceride level is little high.  I recommended that he increase his exercise and decrease his intake of carbs.  Current medicines are reviewed at length with the patient today.  The patient does not have concerns regarding medicines.  The following changes have been made:  no change  Labs/ tests ordered today include:   Orders Placed This Encounter  Procedures  . EKG 12-Lead     Disposition:   I will see him again in 2 years.    Mertie Moores, MD  08/14/2017 9:10 AM    [facility] [place], [place], [place]  [address] Phone: [phone]; Fax: [phone]   [facility]  [address] [place], [place]  [address] [phone]    Fax [phone]

## 2017-10-08 ENCOUNTER — Encounter (INDEPENDENT_AMBULATORY_CARE_PROVIDER_SITE_OTHER): Payer: Medicare Other | Admitting: Ophthalmology

## 2017-10-08 DIAGNOSIS — H338 Other retinal detachments: Secondary | ICD-10-CM

## 2017-10-08 DIAGNOSIS — H33301 Unspecified retinal break, right eye: Secondary | ICD-10-CM | POA: Diagnosis not present

## 2017-10-08 DIAGNOSIS — H43811 Vitreous degeneration, right eye: Secondary | ICD-10-CM | POA: Diagnosis not present

## 2017-12-19 DIAGNOSIS — Z23 Encounter for immunization: Secondary | ICD-10-CM | POA: Diagnosis not present

## 2017-12-31 DIAGNOSIS — E7800 Pure hypercholesterolemia, unspecified: Secondary | ICD-10-CM | POA: Diagnosis not present

## 2017-12-31 DIAGNOSIS — E119 Type 2 diabetes mellitus without complications: Secondary | ICD-10-CM | POA: Diagnosis not present

## 2017-12-31 DIAGNOSIS — Z6829 Body mass index (BMI) 29.0-29.9, adult: Secondary | ICD-10-CM | POA: Diagnosis not present

## 2017-12-31 DIAGNOSIS — I10 Essential (primary) hypertension: Secondary | ICD-10-CM | POA: Diagnosis not present

## 2017-12-31 DIAGNOSIS — H35033 Hypertensive retinopathy, bilateral: Secondary | ICD-10-CM | POA: Diagnosis not present

## 2017-12-31 DIAGNOSIS — E663 Overweight: Secondary | ICD-10-CM | POA: Diagnosis not present

## 2017-12-31 DIAGNOSIS — E559 Vitamin D deficiency, unspecified: Secondary | ICD-10-CM | POA: Diagnosis not present

## 2018-04-08 DIAGNOSIS — Z961 Presence of intraocular lens: Secondary | ICD-10-CM | POA: Diagnosis not present

## 2018-04-08 DIAGNOSIS — H18832 Recurrent erosion of cornea, left eye: Secondary | ICD-10-CM | POA: Diagnosis not present

## 2018-06-03 DIAGNOSIS — E559 Vitamin D deficiency, unspecified: Secondary | ICD-10-CM | POA: Diagnosis not present

## 2018-06-03 DIAGNOSIS — E119 Type 2 diabetes mellitus without complications: Secondary | ICD-10-CM | POA: Diagnosis not present

## 2018-06-03 DIAGNOSIS — Z125 Encounter for screening for malignant neoplasm of prostate: Secondary | ICD-10-CM | POA: Diagnosis not present

## 2018-06-03 DIAGNOSIS — E7800 Pure hypercholesterolemia, unspecified: Secondary | ICD-10-CM | POA: Diagnosis not present

## 2018-06-04 DIAGNOSIS — R82998 Other abnormal findings in urine: Secondary | ICD-10-CM | POA: Diagnosis not present

## 2018-06-04 DIAGNOSIS — I10 Essential (primary) hypertension: Secondary | ICD-10-CM | POA: Diagnosis not present

## 2018-06-10 DIAGNOSIS — E559 Vitamin D deficiency, unspecified: Secondary | ICD-10-CM | POA: Diagnosis not present

## 2018-06-10 DIAGNOSIS — Z0000 Encounter for general adult medical examination without abnormal findings: Secondary | ICD-10-CM | POA: Diagnosis not present

## 2018-06-10 DIAGNOSIS — E663 Overweight: Secondary | ICD-10-CM | POA: Diagnosis not present

## 2018-06-10 DIAGNOSIS — Z1331 Encounter for screening for depression: Secondary | ICD-10-CM | POA: Diagnosis not present

## 2018-06-10 DIAGNOSIS — E7800 Pure hypercholesterolemia, unspecified: Secondary | ICD-10-CM | POA: Diagnosis not present

## 2018-06-10 DIAGNOSIS — G250 Essential tremor: Secondary | ICD-10-CM | POA: Diagnosis not present

## 2018-06-10 DIAGNOSIS — Z1339 Encounter for screening examination for other mental health and behavioral disorders: Secondary | ICD-10-CM | POA: Diagnosis not present

## 2018-06-10 DIAGNOSIS — E119 Type 2 diabetes mellitus without complications: Secondary | ICD-10-CM | POA: Diagnosis not present

## 2018-06-10 DIAGNOSIS — I10 Essential (primary) hypertension: Secondary | ICD-10-CM | POA: Diagnosis not present

## 2018-06-10 DIAGNOSIS — M19049 Primary osteoarthritis, unspecified hand: Secondary | ICD-10-CM | POA: Diagnosis not present

## 2018-06-10 DIAGNOSIS — H35033 Hypertensive retinopathy, bilateral: Secondary | ICD-10-CM | POA: Diagnosis not present

## 2018-07-08 ENCOUNTER — Encounter (INDEPENDENT_AMBULATORY_CARE_PROVIDER_SITE_OTHER): Payer: Medicare Other | Admitting: Ophthalmology

## 2018-07-22 ENCOUNTER — Encounter (INDEPENDENT_AMBULATORY_CARE_PROVIDER_SITE_OTHER): Payer: Medicare Other | Admitting: Ophthalmology

## 2018-07-22 ENCOUNTER — Other Ambulatory Visit: Payer: Self-pay

## 2018-07-22 DIAGNOSIS — H338 Other retinal detachments: Secondary | ICD-10-CM | POA: Diagnosis not present

## 2018-07-22 DIAGNOSIS — H43811 Vitreous degeneration, right eye: Secondary | ICD-10-CM

## 2018-07-22 DIAGNOSIS — H35033 Hypertensive retinopathy, bilateral: Secondary | ICD-10-CM

## 2018-07-22 DIAGNOSIS — H4311 Vitreous hemorrhage, right eye: Secondary | ICD-10-CM

## 2018-07-22 DIAGNOSIS — I10 Essential (primary) hypertension: Secondary | ICD-10-CM

## 2018-08-12 ENCOUNTER — Other Ambulatory Visit: Payer: Self-pay

## 2018-08-12 ENCOUNTER — Encounter (INDEPENDENT_AMBULATORY_CARE_PROVIDER_SITE_OTHER): Payer: Medicare Other | Admitting: Ophthalmology

## 2018-08-12 DIAGNOSIS — H4311 Vitreous hemorrhage, right eye: Secondary | ICD-10-CM | POA: Diagnosis not present

## 2018-08-12 DIAGNOSIS — H338 Other retinal detachments: Secondary | ICD-10-CM

## 2018-08-12 DIAGNOSIS — H33301 Unspecified retinal break, right eye: Secondary | ICD-10-CM

## 2018-08-12 DIAGNOSIS — H43811 Vitreous degeneration, right eye: Secondary | ICD-10-CM | POA: Diagnosis not present

## 2018-09-09 ENCOUNTER — Other Ambulatory Visit: Payer: Self-pay

## 2018-09-09 ENCOUNTER — Encounter (INDEPENDENT_AMBULATORY_CARE_PROVIDER_SITE_OTHER): Payer: Medicare Other | Admitting: Ophthalmology

## 2018-09-09 DIAGNOSIS — H4311 Vitreous hemorrhage, right eye: Secondary | ICD-10-CM | POA: Diagnosis not present

## 2018-09-09 DIAGNOSIS — H35033 Hypertensive retinopathy, bilateral: Secondary | ICD-10-CM | POA: Diagnosis not present

## 2018-09-09 DIAGNOSIS — H338 Other retinal detachments: Secondary | ICD-10-CM

## 2018-09-09 DIAGNOSIS — I10 Essential (primary) hypertension: Secondary | ICD-10-CM

## 2018-10-07 ENCOUNTER — Encounter (INDEPENDENT_AMBULATORY_CARE_PROVIDER_SITE_OTHER): Payer: Medicare Other | Admitting: Ophthalmology

## 2018-10-07 ENCOUNTER — Other Ambulatory Visit: Payer: Self-pay

## 2018-10-07 DIAGNOSIS — I10 Essential (primary) hypertension: Secondary | ICD-10-CM | POA: Diagnosis not present

## 2018-10-07 DIAGNOSIS — H33301 Unspecified retinal break, right eye: Secondary | ICD-10-CM | POA: Diagnosis not present

## 2018-10-07 DIAGNOSIS — H338 Other retinal detachments: Secondary | ICD-10-CM | POA: Diagnosis not present

## 2018-10-07 DIAGNOSIS — H4311 Vitreous hemorrhage, right eye: Secondary | ICD-10-CM | POA: Diagnosis not present

## 2018-10-07 DIAGNOSIS — H35033 Hypertensive retinopathy, bilateral: Secondary | ICD-10-CM | POA: Diagnosis not present

## 2018-10-07 DIAGNOSIS — H43811 Vitreous degeneration, right eye: Secondary | ICD-10-CM

## 2018-10-14 ENCOUNTER — Encounter (INDEPENDENT_AMBULATORY_CARE_PROVIDER_SITE_OTHER): Payer: Medicare Other | Admitting: Ophthalmology

## 2018-10-23 DIAGNOSIS — Z23 Encounter for immunization: Secondary | ICD-10-CM | POA: Diagnosis not present

## 2018-11-04 ENCOUNTER — Encounter (INDEPENDENT_AMBULATORY_CARE_PROVIDER_SITE_OTHER): Payer: Medicare Other | Admitting: Ophthalmology

## 2018-11-11 ENCOUNTER — Encounter (INDEPENDENT_AMBULATORY_CARE_PROVIDER_SITE_OTHER): Payer: Medicare Other | Admitting: Ophthalmology

## 2018-12-04 ENCOUNTER — Other Ambulatory Visit: Payer: Self-pay

## 2018-12-04 ENCOUNTER — Ambulatory Visit (INDEPENDENT_AMBULATORY_CARE_PROVIDER_SITE_OTHER): Payer: Medicare Other | Admitting: Ophthalmology

## 2018-12-04 ENCOUNTER — Encounter (INDEPENDENT_AMBULATORY_CARE_PROVIDER_SITE_OTHER): Payer: Self-pay | Admitting: Ophthalmology

## 2018-12-04 DIAGNOSIS — H3322 Serous retinal detachment, left eye: Secondary | ICD-10-CM

## 2018-12-04 DIAGNOSIS — H43391 Other vitreous opacities, right eye: Secondary | ICD-10-CM | POA: Diagnosis not present

## 2018-12-04 DIAGNOSIS — Z961 Presence of intraocular lens: Secondary | ICD-10-CM

## 2018-12-04 DIAGNOSIS — Z9889 Other specified postprocedural states: Secondary | ICD-10-CM | POA: Diagnosis not present

## 2018-12-04 DIAGNOSIS — H35033 Hypertensive retinopathy, bilateral: Secondary | ICD-10-CM

## 2018-12-04 DIAGNOSIS — H3581 Retinal edema: Secondary | ICD-10-CM

## 2018-12-04 DIAGNOSIS — I10 Essential (primary) hypertension: Secondary | ICD-10-CM | POA: Diagnosis not present

## 2018-12-04 NOTE — Progress Notes (Addendum)
[facility] Note  12/04/2018     CHIEF COMPLAINT Patient presents for Retina Evaluation   HISTORY OF PRESENT ILLNESS: Anthony Brennan is a 68 y.o. male who presents to the clinic today for:   HPI    Retina Evaluation    In right eye.  This started 7 hours ago.  Duration of 2 hours.  Associated Symptoms Floaters and Photophobia.  Context:  distance vision, mid-range vision and near vision.  Treatments tried include no treatments.  I, the attending physician,  performed the HPI with the patient and updated documentation appropriately.          Comments    Pt of Dr. Zigmund Daniel.  Woke up this morning, and when he went outside he noticed a "grayish-film" over his right eye, and what looked like "sand mixing with blood" in his right eye vision.  Symptoms lasted for a couple of hours, but seem to have resolved now.  Pt became concerned due to previous issues with OS.  VA excellent now cc OD.  Pt is NLP OS.  Denies pain, flashes, but has a few small floaters OD.  Systane prn OU.  BS unknown.  A1C "under 7."       Last edited by Bernarda Caffey, MD on 12/04/2018  4:26 PM. (History)    pt is a regular pt of Dr. Zigmund Daniel, he states he has a history of left eye retinal detachment back in 2007 with Dr. Zigmund Daniel, pt states back in March he had very bright defined floaters in his right eye, he states he saw Zigmund Daniel at that time, he states he did not have any treatments for it, but it ended up clearing up on it's own, pt states Dr. Zigmund Daniel has assured him there is no breaks or tears in the retina, pt states he has had a couple laser procedures back in college, pt states today he noticed 2 or 3 floaters while he was inside and states that when he went outside in the sunlight he noticed "gray, grainy" floaters, he denies flashes of lights, jagged lights or headaches, pt states he has problems with his left eye cornea eroding periodically, he states he sees Dr. Edilia Bo at [facility] for  his cornea   Referring physician: Hayden Pedro, MD [address],  [place] [address]  HISTORICAL INFORMATION:   Selected notes from the MEDICAL RECORD NUMBER Dr. Zigmund Daniel pt, vision changes LEE:  Ocular Hx- PMH-   CURRENT MEDICATIONS: No current outpatient medications on file. (Ophthalmic Drugs)   No current facility-administered medications for this visit.  (Ophthalmic Drugs)   Current Outpatient Medications (Other)  Medication Sig  . aspirin 81 MG tablet Take 81 mg by mouth daily.    . Cholecalciferol (VITAMIN D PO) Take 1 capsule by mouth daily.   . Dulaglutide (TRULICITY) A999333 0000000 SOPN Inject 1 Dose into the skin once a week.  . empagliflozin (JARDIANCE) 25 MG TABS tablet Take 25 mg by mouth daily.  . metFORMIN (GLUCOPHAGE) 500 MG tablet Take 500 mg by mouth daily.    . metFORMIN (GLUCOPHAGE-XR) 500 MG 24 hr tablet Take 1,000 mg by mouth 2 (two) times daily.  . pioglitazone (ACTOS) 15 MG tablet Take 30 mg by mouth daily.    . ramipril (ALTACE) 10 MG tablet Take 1 tablet (10 mg total) by mouth 2 (two) times daily.  . rosuvastatin (CRESTOR) 40 MG tablet Take 1 tablet by mouth daily.   No current  facility-administered medications for this visit.  (Other)      REVIEW OF SYSTEMS: ROS    Positive for: Endocrine, Eyes   Negative for: Constitutional, Gastrointestinal, Neurological, Skin, Genitourinary, Musculoskeletal, HENT, Cardiovascular, Respiratory, Psychiatric, Allergic/Imm, Heme/Lymph   Last edited by Matthew Folks, COA on 12/04/2018  2:36 PM. (History)       ALLERGIES Allergies  Allergen Reactions  . Other     Steroid eyedrop    PAST MEDICAL HISTORY Past Medical History:  Diagnosis Date  . Chest pain    Recent episode of - stress Myoview study was normal  . Diabetes mellitus   . Hyperlipidemia   . Hypertension   . Hypertensive retinopathy    OU   Past Surgical History:  Procedure Laterality Date  . APPENDECTOMY  1970  .  CARDIOVASCULAR STRESS TEST  02-21-2010   EF 65%  . CATARACT EXTRACTION Bilateral   . EYE SURGERY    . HERNIA REPAIR  68 years old  . RETINAL DETACHMENT SURGERY Left    S/P SB, PPV    FAMILY HISTORY Family History  Problem Relation Age of Onset  . Hypertension Mother   . Diabetes Father   . Hyperlipidemia Brother   . Diabetes Paternal Uncle   . Diabetes Other     SOCIAL HISTORY Social History   Tobacco Use  . Smoking status: Never Smoker  . Smokeless tobacco: Never Used  Substance Use Topics  . Alcohol use: Yes    Comment: Only Socially  . Drug use: Not on file         OPHTHALMIC EXAM:  Base Eye Exam    Visual Acuity (Snellen - Linear)      Right Left   Dist cc 20/15 -2 NLP   Correction: Glasses       Tonometry (Tonopen, 2:40 PM)      Right Left   Pressure 15 6       Pupils      Dark Light Shape React APD   Right 4 3 Round Brisk None   Left 4 3 Irregular Minimal None       Visual Fields (Counting fingers)      Left Right     Full   Restrictions Total superior temporal, inferior temporal, superior nasal, inferior nasal deficiencies        Extraocular Movement      Right Left    Full, Ortho Full, Ortho       Neuro/Psych    Oriented x3: Yes   Mood/Affect: Normal       Dilation    Both eyes: 1.0% Mydriacyl, 2.5% Phenylephrine @ 2:40 PM        Slit Lamp and Fundus Exam    Slit Lamp Exam      Right Left   Lids/Lashes Mild Dermatochalasis - upper lid, mild Telangiectasia, mild Meibomian gland dysfunction Dermatochalasis - upper lid, Telangiectasia, Meibomian gland dysfunction   Conjunctiva/Sclera White and quiet White and quiet   Cornea Clear, well healed temporal cataract wounds Central band K   Anterior Chamber Deep and quiet IK touch 360, inferior bubble?PFO   Iris Round and dilated irregular; peripheral IK touch 360   Lens Well centered 3 piece IOL 3 piece IOL displaced into [facility]   Vitreous +PVD w/ Vitreous syneresis and condensations, no  pigment, punctate VH particles settled inferiorly no view       Fundus Exam      Right Left   Disc anomalous, 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 Central Foveal Thickness: 315. Progression has no prior data. Findings include normal foveal contour, no IRF, no SRF, retinal drusen  (Mild vitreous opacities; peripapillary staphyloma).   Notes *Images captured and stored on drive  Diagnosis / Impression:  OD: NFP; no IRF/SRF; Mild vitreous opacities; peripapillary staphyloma and atrophy OS: no image obtained  Clinical management:  See below  Abbreviations: NFP - Normal foveal profile. CME - cystoid macular edema. PED - pigment epithelial detachment. IRF - intraretinal fluid. SRF - subretinal fluid. EZ - ellipsoid zone. ERM - epiretinal membrane. ORA - outer retinal atrophy. ORT - outer retinal tubulation. SRHM - subretinal hyper-reflective material                 ASSESSMENT/PLAN:    ICD-10-CM   1. Vitreous floaters of right eye  H43.391   2. History of repair of retinal tear by laser photocoagulation  Z98.890   3. Left retinal detachment  H33.22   4. Essential hypertension  I10   5. Hypertensive retinopathy of both eyes  H35.033   6. Retinal edema  H35.81 OCT, Retina - OU - Both Eyes  7. Pseudophakia of both eyes  Z96.1     1. Symptomatic vitreous floaters OD - Dr. Zigmund Daniel patient - pt with  acute development of vitreous floaters this morning and presented urgently for evaluation - BCVA stable at 20/15 - exam shows vitreous condensations, no new RT/RD on scleral depressed exam - Reviewed s/s of RT/RD - Strict return precautions for any such RT/RD signs/symptoms - recommend f/u with Dr. Zigmund Daniel in 1-2 wks - pt states he will have his wife call Deatra Ina on Monday to schedule f/u  2. History of multiple retinal tears s/p laser retinopexy OD - stable   3. History of retinal detachment OS - NLP vision with chronic RD - OS also has band keratopathy, displaced 3-piece IOL  4,5. Hypertensive retinopathy OU - discussed importance of tight BP control - monitor  6. No retinal edema on exam or OCT  7. Pseudophakia OU  Ophthalmic Meds Ordered this visit:  No orders of the defined types were placed in this encounter.      Return for 1-2 wks w/ Zigmund Daniel.  There are no Patient Instructions on file for this visit.   Explained the diagnoses, plan, and follow up with the patient and they expressed understanding.  Patient expressed understanding of the importance of proper follow up care.   This document serves as a record of services personally performed by Gardiner Sleeper, MD, PhD. It was created on their behalf by Ernest Mallick, OA, an ophthalmic assistant. The creation  of this record is the provider's dictation and/or activities during the visit.    Electronically signed by: Ernest Mallick, OA  10.02.2020 4:27 PM    Gardiner Sleeper, M.D., Ph.D. Diseases & Surgery of the Retina and Vitreous [facility]  I have reviewed the above documentation for accuracy and completeness, and I agree with the above. Gardiner Sleeper, M.D., Ph.D. 12/04/18 4:27 PM     Abbreviations: M myopia (nearsighted); A astigmatism; H hyperopia (farsighted); P presbyopia; Mrx spectacle prescription;  CTL contact lenses; OD right eye; OS left eye; OU both eyes  XT exotropia;  ET esotropia; PEK punctate epithelial keratitis; PEE punctate epithelial erosions; DES dry eye syndrome; MGD meibomian gland dysfunction; ATs artificial tears; PFAT's preservative free artificial tears; [place] nuclear sclerotic cataract; PSC posterior subcapsular cataract; ERM epi-retinal membrane; PVD posterior vitreous detachment; RD retinal detachment; DM diabetes mellitus; DR diabetic retinopathy; NPDR non-proliferative diabetic retinopathy; PDR proliferative diabetic retinopathy; CSME clinically significant macular edema; DME diabetic macular edema; dbh dot blot hemorrhages; CWS cotton wool spot; POAG primary open angle glaucoma; C/D cup-to-disc ratio; HVF humphrey visual field; GVF goldmann visual field; OCT optical coherence tomography; IOP intraocular pressure; BRVO Branch retinal vein occlusion; CRVO central retinal vein occlusion; CRAO central retinal artery occlusion; BRAO branch retinal artery occlusion; RT retinal tear; SB scleral buckle; PPV pars plana vitrectomy; VH Vitreous hemorrhage; PRP panretinal laser photocoagulation; IVK intravitreal kenalog; VMT vitreomacular traction; MH Macular hole;  NVD neovascularization of the disc; NVE neovascularization elsewhere; AREDS age related eye disease study; ARMD age related macular degeneration; POAG primary open angle glaucoma; EBMD epithelial/anterior basement membrane dystrophy; ACIOL anterior chamber intraocular lens; IOL intraocular lens; PCIOL posterior chamber intraocular lens; Phaco/IOL phacoemulsification with intraocular lens placement; [place] photorefractive keratectomy; LASIK laser assisted in situ keratomileusis; HTN hypertension; DM diabetes mellitus; COPD chronic obstructive pulmonary disease

## 2018-12-15 DIAGNOSIS — G250 Essential tremor: Secondary | ICD-10-CM | POA: Diagnosis not present

## 2018-12-15 DIAGNOSIS — E7800 Pure hypercholesterolemia, unspecified: Secondary | ICD-10-CM | POA: Diagnosis not present

## 2018-12-15 DIAGNOSIS — E119 Type 2 diabetes mellitus without complications: Secondary | ICD-10-CM | POA: Diagnosis not present

## 2018-12-15 DIAGNOSIS — M19049 Primary osteoarthritis, unspecified hand: Secondary | ICD-10-CM | POA: Diagnosis not present

## 2018-12-15 DIAGNOSIS — E663 Overweight: Secondary | ICD-10-CM | POA: Diagnosis not present

## 2018-12-15 DIAGNOSIS — E559 Vitamin D deficiency, unspecified: Secondary | ICD-10-CM | POA: Diagnosis not present

## 2018-12-15 DIAGNOSIS — I10 Essential (primary) hypertension: Secondary | ICD-10-CM | POA: Diagnosis not present

## 2018-12-15 DIAGNOSIS — H35033 Hypertensive retinopathy, bilateral: Secondary | ICD-10-CM | POA: Diagnosis not present

## 2018-12-23 DIAGNOSIS — E119 Type 2 diabetes mellitus without complications: Secondary | ICD-10-CM | POA: Diagnosis not present

## 2019-03-08 ENCOUNTER — Encounter (INDEPENDENT_AMBULATORY_CARE_PROVIDER_SITE_OTHER): Payer: Medicare Other | Admitting: Ophthalmology

## 2019-03-08 DIAGNOSIS — H33301 Unspecified retinal break, right eye: Secondary | ICD-10-CM | POA: Diagnosis not present

## 2019-03-08 DIAGNOSIS — H43813 Vitreous degeneration, bilateral: Secondary | ICD-10-CM | POA: Diagnosis not present

## 2019-03-08 DIAGNOSIS — H4311 Vitreous hemorrhage, right eye: Secondary | ICD-10-CM

## 2019-03-08 DIAGNOSIS — H338 Other retinal detachments: Secondary | ICD-10-CM

## 2019-03-08 DIAGNOSIS — I10 Essential (primary) hypertension: Secondary | ICD-10-CM | POA: Diagnosis not present

## 2019-03-08 DIAGNOSIS — H35033 Hypertensive retinopathy, bilateral: Secondary | ICD-10-CM

## 2019-03-19 ENCOUNTER — Other Ambulatory Visit: Payer: Self-pay

## 2019-03-19 ENCOUNTER — Encounter (INDEPENDENT_AMBULATORY_CARE_PROVIDER_SITE_OTHER): Payer: Medicare Other | Admitting: Ophthalmology

## 2019-03-19 DIAGNOSIS — H33301 Unspecified retinal break, right eye: Secondary | ICD-10-CM

## 2019-03-19 DIAGNOSIS — H4311 Vitreous hemorrhage, right eye: Secondary | ICD-10-CM

## 2019-03-24 DIAGNOSIS — Z961 Presence of intraocular lens: Secondary | ICD-10-CM | POA: Diagnosis not present

## 2019-03-24 DIAGNOSIS — H4311 Vitreous hemorrhage, right eye: Secondary | ICD-10-CM | POA: Diagnosis not present

## 2019-03-24 DIAGNOSIS — H33311 Horseshoe tear of retina without detachment, right eye: Secondary | ICD-10-CM | POA: Diagnosis not present

## 2019-03-24 DIAGNOSIS — H3589 Other specified retinal disorders: Secondary | ICD-10-CM | POA: Diagnosis not present

## 2019-03-24 DIAGNOSIS — Z8669 Personal history of other diseases of the nervous system and sense organs: Secondary | ICD-10-CM | POA: Diagnosis not present

## 2019-03-31 ENCOUNTER — Ambulatory Visit: Payer: Medicare Other

## 2019-04-09 ENCOUNTER — Ambulatory Visit: Payer: Medicare Other

## 2019-04-21 ENCOUNTER — Ambulatory Visit: Payer: Medicare Other

## 2019-04-21 ENCOUNTER — Encounter (INDEPENDENT_AMBULATORY_CARE_PROVIDER_SITE_OTHER): Payer: Medicare Other | Admitting: Ophthalmology

## 2019-05-15 DIAGNOSIS — Q141 Congenital malformation of retina: Secondary | ICD-10-CM | POA: Diagnosis not present

## 2019-05-15 DIAGNOSIS — H4311 Vitreous hemorrhage, right eye: Secondary | ICD-10-CM | POA: Diagnosis not present

## 2019-05-15 DIAGNOSIS — H18422 Band keratopathy, left eye: Secondary | ICD-10-CM | POA: Diagnosis not present

## 2019-05-15 DIAGNOSIS — Z8669 Personal history of other diseases of the nervous system and sense organs: Secondary | ICD-10-CM | POA: Diagnosis not present

## 2019-05-18 ENCOUNTER — Encounter (INDEPENDENT_AMBULATORY_CARE_PROVIDER_SITE_OTHER): Payer: Medicare Other | Admitting: Ophthalmology

## 2019-05-18 DIAGNOSIS — H35033 Hypertensive retinopathy, bilateral: Secondary | ICD-10-CM

## 2019-05-18 DIAGNOSIS — H33301 Unspecified retinal break, right eye: Secondary | ICD-10-CM | POA: Diagnosis not present

## 2019-05-18 DIAGNOSIS — H4311 Vitreous hemorrhage, right eye: Secondary | ICD-10-CM | POA: Diagnosis not present

## 2019-05-18 DIAGNOSIS — H338 Other retinal detachments: Secondary | ICD-10-CM

## 2019-05-18 DIAGNOSIS — H43813 Vitreous degeneration, bilateral: Secondary | ICD-10-CM | POA: Diagnosis not present

## 2019-05-18 DIAGNOSIS — I10 Essential (primary) hypertension: Secondary | ICD-10-CM | POA: Diagnosis not present

## 2019-05-21 ENCOUNTER — Encounter (INDEPENDENT_AMBULATORY_CARE_PROVIDER_SITE_OTHER): Payer: Medicare Other | Admitting: Ophthalmology

## 2019-05-21 DIAGNOSIS — H4311 Vitreous hemorrhage, right eye: Secondary | ICD-10-CM

## 2019-05-24 DIAGNOSIS — Z961 Presence of intraocular lens: Secondary | ICD-10-CM | POA: Diagnosis not present

## 2019-05-24 DIAGNOSIS — H18832 Recurrent erosion of cornea, left eye: Secondary | ICD-10-CM | POA: Diagnosis not present

## 2019-05-24 DIAGNOSIS — H4311 Vitreous hemorrhage, right eye: Secondary | ICD-10-CM | POA: Diagnosis not present

## 2019-06-16 DIAGNOSIS — E559 Vitamin D deficiency, unspecified: Secondary | ICD-10-CM | POA: Diagnosis not present

## 2019-06-16 DIAGNOSIS — I10 Essential (primary) hypertension: Secondary | ICD-10-CM | POA: Diagnosis not present

## 2019-06-16 DIAGNOSIS — E119 Type 2 diabetes mellitus without complications: Secondary | ICD-10-CM | POA: Diagnosis not present

## 2019-06-16 DIAGNOSIS — E7800 Pure hypercholesterolemia, unspecified: Secondary | ICD-10-CM | POA: Diagnosis not present

## 2019-06-16 DIAGNOSIS — Z125 Encounter for screening for malignant neoplasm of prostate: Secondary | ICD-10-CM | POA: Diagnosis not present

## 2019-06-23 DIAGNOSIS — E663 Overweight: Secondary | ICD-10-CM | POA: Diagnosis not present

## 2019-06-23 DIAGNOSIS — M19049 Primary osteoarthritis, unspecified hand: Secondary | ICD-10-CM | POA: Diagnosis not present

## 2019-06-23 DIAGNOSIS — E119 Type 2 diabetes mellitus without complications: Secondary | ICD-10-CM | POA: Diagnosis not present

## 2019-06-23 DIAGNOSIS — Z0000 Encounter for general adult medical examination without abnormal findings: Secondary | ICD-10-CM | POA: Diagnosis not present

## 2019-06-23 DIAGNOSIS — H35033 Hypertensive retinopathy, bilateral: Secondary | ICD-10-CM | POA: Diagnosis not present

## 2019-06-23 DIAGNOSIS — G250 Essential tremor: Secondary | ICD-10-CM | POA: Diagnosis not present

## 2019-06-23 DIAGNOSIS — R82998 Other abnormal findings in urine: Secondary | ICD-10-CM | POA: Diagnosis not present

## 2019-06-23 DIAGNOSIS — Z1212 Encounter for screening for malignant neoplasm of rectum: Secondary | ICD-10-CM | POA: Diagnosis not present

## 2019-06-23 DIAGNOSIS — I10 Essential (primary) hypertension: Secondary | ICD-10-CM | POA: Diagnosis not present

## 2019-06-23 DIAGNOSIS — E7800 Pure hypercholesterolemia, unspecified: Secondary | ICD-10-CM | POA: Diagnosis not present

## 2019-07-06 DIAGNOSIS — E119 Type 2 diabetes mellitus without complications: Secondary | ICD-10-CM | POA: Diagnosis not present

## 2019-07-06 DIAGNOSIS — H442C2 Degenerative myopia with retinal detachment, left eye: Secondary | ICD-10-CM | POA: Diagnosis not present

## 2019-07-06 DIAGNOSIS — H33311 Horseshoe tear of retina without detachment, right eye: Secondary | ICD-10-CM | POA: Diagnosis not present

## 2019-07-06 DIAGNOSIS — Z961 Presence of intraocular lens: Secondary | ICD-10-CM | POA: Diagnosis not present

## 2019-07-06 DIAGNOSIS — H4423 Degenerative myopia, bilateral: Secondary | ICD-10-CM | POA: Diagnosis not present

## 2019-07-21 ENCOUNTER — Encounter (INDEPENDENT_AMBULATORY_CARE_PROVIDER_SITE_OTHER): Payer: Medicare Other | Admitting: Ophthalmology

## 2019-08-04 ENCOUNTER — Other Ambulatory Visit: Payer: Self-pay

## 2019-08-04 ENCOUNTER — Encounter: Payer: Self-pay | Admitting: Cardiovascular Disease

## 2019-08-04 ENCOUNTER — Ambulatory Visit (INDEPENDENT_AMBULATORY_CARE_PROVIDER_SITE_OTHER): Payer: Medicare Other | Admitting: Cardiovascular Disease

## 2019-08-04 VITALS — BP 148/76 | HR 82 | Ht 67.0 in | Wt 174.8 lb

## 2019-08-04 DIAGNOSIS — I10 Essential (primary) hypertension: Secondary | ICD-10-CM | POA: Diagnosis not present

## 2019-08-04 DIAGNOSIS — E782 Mixed hyperlipidemia: Secondary | ICD-10-CM

## 2019-08-04 NOTE — Progress Notes (Signed)
Cardiology Office Note   Date:  08/04/2019   ID:  Anthony Brennan, DOB 01/29/1951, MRN [id number]  PCP:  Haywood Pao, MD  Cardiologist:   Mertie Moores, MD   Chief Complaint  Patient presents with  . Hypertension  . Hyperlipidemia   Problem List:   1. HTN 2. Hyperlipidemia 3. DM     Anthony Brennan is a middle age gentleman with a hx of HTN and hyperlipidemia, DM. He has done well .  June 02, 2012:  Anthony Brennan is doing well. No CP or dyspnea. Several dizzy spells yesterday - one episode occurred while he was sitting at his desk . He has just started walking again this spring.    Jul 27, 2014 : Anthony Brennan is a 69 y.o. male who presents for his HTN. Walking regularly.  No CP or dyspnea   Jul 19, 2015:  Doing well.    BP is regular at home .  August 14, 2017:  Anthony Brennan is seen back today for follow-up visit.  He has a history of hypertension and hyperlipidemia.  He was last seen 2 years ago. Has had some bruising  Exercising some.    Still working in the [employer].   Had labs drawn at [facility]. Glucose was mildly elevated at 112.  Creatinine is normal at 0.8.  Sodium and potassium are normal.  Liver enzymes are normal.  White blood cell count is normal.  Apoprotein B is normal.  PSA was 1.634.  HDL is 51.  LDL 76.  Total cholesterol is 145.  Triglyceride level is 88.  August 04, 2019:  Anthony Brennan is seen today for follow-up visit for his hypertension and hyperlipidemia.  He was seen approximately 2 years ago. He brought labs with him from his primary MD  CHol is 136 Trigs = 91 HDL = 46 LDL = 72  Doing well No cp.  No dyspnea.   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   BP is a bit elevated here today .  BP is usually 120's   Past Medical History:  Diagnosis Date  . Chest pain    Recent episode of - stress Myoview study was normal  . Diabetes mellitus   . Hyperlipidemia   . Hypertension   . Hypertensive retinopathy    OU    Past Surgical History:    Procedure Laterality Date  . APPENDECTOMY  1970  . CARDIOVASCULAR STRESS TEST  02-21-2010   EF 65%  . CATARACT EXTRACTION Bilateral   . EYE SURGERY    . HERNIA REPAIR  69 years old  . RETINAL DETACHMENT SURGERY Left    S/P SB, PPV     Current Outpatient Medications  Medication Sig Dispense Refill  . aspirin 81 MG tablet Take 81 mg by mouth daily.      . Cholecalciferol (VITAMIN D PO) Take 1 capsule by mouth daily.     . Dulaglutide (TRULICITY) A999333 0000000 SOPN Inject 1 Dose into the skin once a week.    . empagliflozin (JARDIANCE) 25 MG TABS tablet Take 25 mg by mouth daily.    . metFORMIN (GLUCOPHAGE-XR) 500 MG 24 hr tablet Take 500 mg by mouth 2 (two) times daily. 2 tablets in the morning and 1 tablet at night    . ramipril (ALTACE) 10 MG tablet Take 1 tablet (10 mg total) by mouth 2 (two) times daily. 180 tablet 3  . rosuvastatin (CRESTOR) 40 MG tablet Take 1 tablet by mouth daily.  3  . valACYclovir (VALTREX) 1000 MG tablet Take 2 tablets by mouth as needed. For fever blisters    . pioglitazone (ACTOS) 30 MG tablet Take 1 tablet by mouth daily.     No current facility-administered medications for this visit.    Allergies:   Other    Social History:  The patient  reports that he has never smoked. He has never used smokeless tobacco. He reports current alcohol use.   Family History:  The patient's family history includes Diabetes in his father, paternal uncle, and another family member; Hyperlipidemia in his brother; Hypertension in his mother.    ROS:    Reviewed and current history, otherwise review of systems is negative.  Physical Exam: There were no vitals taken for this visit.  GEN:  Well nourished, well developed in no acute distress HEENT: Normal NECK: No JVD; No carotid bruits LYMPHATICS: No lymphadenopathy CARDIAC: RRR , no murmurs, rubs, gallops RESPIRATORY:  Clear to auscultation without rales, wheezing or rhonchi  ABDOMEN: Soft, non-tender,  non-distended MUSCULOSKELETAL:  No edema; No deformity  SKIN: Warm and dry NEUROLOGIC:  Alert and oriented x 3   EKG:     August 04, 2019: Normal sinus rhythm with rare premature ventricular contractions.  No ST or T wave changes.  Recent Labs: No results found for requested labs within last 8760 hours.    Lipid Panel No results found for: CHOL, TRIG, HDL, CHOLHDL, VLDL, LDLCALC, LDLDIRECT    Wt Readings from Last 3 Encounters:  08/14/17 169 lb (76.7 kg)  07/19/15 191 lb (86.6 kg)  07/27/14 181 lb 6.4 oz (82.3 kg)      Other studies Reviewed: Additional studies/ records that were reviewed today include: . Review of the above records demonstrates:    ASSESSMENT AND PLAN:  1. HTN-     blood pressure is mildly elevated today although he states that it is usually in the 120s.  I would have a low threshold to start him on low-dose HCTZ if his blood pressure remains elevated.  I have advised him to check his blood pressure on a daily basis and to keep a record.  If his blood pressure remains fairly well controlled and we will continue with his current medications.  If his blood pressure shows some persistent elevations then we should start him on low-dose HCTZ and potassium.  I have encouraged him to start walking at least 3-4 times a week 2 to 3 miles each day.  He will also work on some weight loss.  2. Hyperlipidemia -he brought labs from Dr. Loren Racer office.  His LDL is 72.  Continue current medications.  3.  PVCs:    Had a PVC on his ECG .  He is completely asymptomatic.  I will see him again in 1 year.   Current medicines are reviewed at length with the patient today.  The patient does not have concerns regarding medicines.  The following changes have been made:  no change  Labs/ tests ordered today include:   No orders of the defined types were placed in this encounter.    Disposition:   I will see him again in 1 year.    Mertie Moores, MD  08/04/2019 8:10 AM      [facility] [place], [place], [place]  [address] Phone: [phone]; Fax: [phone]

## 2019-08-04 NOTE — Patient Instructions (Signed)
Your physician recommends that you continue on your current medications as directed. Please refer to the Current Medication list given to you today.   Your physician wants you to follow-up in: YEAR WITH DR NAHSER  You will receive a reminder letter in the mail two months in advance. If you don't receive a letter, please call our office to schedule the follow-up appointment.  

## 2019-08-25 ENCOUNTER — Encounter (INDEPENDENT_AMBULATORY_CARE_PROVIDER_SITE_OTHER): Payer: Medicare Other | Admitting: Ophthalmology

## 2019-08-25 ENCOUNTER — Other Ambulatory Visit: Payer: Self-pay

## 2019-08-25 DIAGNOSIS — H43813 Vitreous degeneration, bilateral: Secondary | ICD-10-CM | POA: Diagnosis not present

## 2019-08-25 DIAGNOSIS — H33301 Unspecified retinal break, right eye: Secondary | ICD-10-CM

## 2019-08-25 DIAGNOSIS — H4311 Vitreous hemorrhage, right eye: Secondary | ICD-10-CM | POA: Diagnosis not present

## 2019-12-04 DIAGNOSIS — Z23 Encounter for immunization: Secondary | ICD-10-CM | POA: Diagnosis not present

## 2020-01-05 DIAGNOSIS — E119 Type 2 diabetes mellitus without complications: Secondary | ICD-10-CM | POA: Diagnosis not present

## 2020-01-05 DIAGNOSIS — E7800 Pure hypercholesterolemia, unspecified: Secondary | ICD-10-CM | POA: Diagnosis not present

## 2020-01-05 DIAGNOSIS — I10 Essential (primary) hypertension: Secondary | ICD-10-CM | POA: Diagnosis not present

## 2020-01-05 DIAGNOSIS — G250 Essential tremor: Secondary | ICD-10-CM | POA: Diagnosis not present

## 2020-01-05 DIAGNOSIS — E559 Vitamin D deficiency, unspecified: Secondary | ICD-10-CM | POA: Diagnosis not present

## 2020-01-05 DIAGNOSIS — M19049 Primary osteoarthritis, unspecified hand: Secondary | ICD-10-CM | POA: Diagnosis not present

## 2020-01-05 DIAGNOSIS — E663 Overweight: Secondary | ICD-10-CM | POA: Diagnosis not present

## 2020-01-05 DIAGNOSIS — H35033 Hypertensive retinopathy, bilateral: Secondary | ICD-10-CM | POA: Diagnosis not present

## 2020-01-07 DIAGNOSIS — Z23 Encounter for immunization: Secondary | ICD-10-CM | POA: Diagnosis not present

## 2020-03-08 ENCOUNTER — Encounter (INDEPENDENT_AMBULATORY_CARE_PROVIDER_SITE_OTHER): Payer: Medicare Other | Admitting: Ophthalmology

## 2020-03-08 ENCOUNTER — Other Ambulatory Visit: Payer: Self-pay

## 2020-03-08 DIAGNOSIS — H338 Other retinal detachments: Secondary | ICD-10-CM | POA: Diagnosis not present

## 2020-03-08 DIAGNOSIS — H35033 Hypertensive retinopathy, bilateral: Secondary | ICD-10-CM

## 2020-03-08 DIAGNOSIS — H43811 Vitreous degeneration, right eye: Secondary | ICD-10-CM | POA: Diagnosis not present

## 2020-03-08 DIAGNOSIS — I10 Essential (primary) hypertension: Secondary | ICD-10-CM | POA: Diagnosis not present

## 2020-03-08 DIAGNOSIS — H33303 Unspecified retinal break, bilateral: Secondary | ICD-10-CM | POA: Diagnosis not present

## 2020-05-10 DIAGNOSIS — H4311 Vitreous hemorrhage, right eye: Secondary | ICD-10-CM | POA: Diagnosis not present

## 2020-05-10 DIAGNOSIS — Z961 Presence of intraocular lens: Secondary | ICD-10-CM | POA: Diagnosis not present

## 2020-05-10 DIAGNOSIS — H18832 Recurrent erosion of cornea, left eye: Secondary | ICD-10-CM | POA: Diagnosis not present

## 2020-06-05 DIAGNOSIS — R059 Cough, unspecified: Secondary | ICD-10-CM | POA: Diagnosis not present

## 2020-06-05 DIAGNOSIS — J069 Acute upper respiratory infection, unspecified: Secondary | ICD-10-CM | POA: Diagnosis not present

## 2020-06-05 DIAGNOSIS — I10 Essential (primary) hypertension: Secondary | ICD-10-CM | POA: Diagnosis not present

## 2020-06-28 DIAGNOSIS — Z125 Encounter for screening for malignant neoplasm of prostate: Secondary | ICD-10-CM | POA: Diagnosis not present

## 2020-06-28 DIAGNOSIS — E7800 Pure hypercholesterolemia, unspecified: Secondary | ICD-10-CM | POA: Diagnosis not present

## 2020-06-28 DIAGNOSIS — E559 Vitamin D deficiency, unspecified: Secondary | ICD-10-CM | POA: Diagnosis not present

## 2020-06-28 DIAGNOSIS — E119 Type 2 diabetes mellitus without complications: Secondary | ICD-10-CM | POA: Diagnosis not present

## 2020-07-12 DIAGNOSIS — Z1389 Encounter for screening for other disorder: Secondary | ICD-10-CM | POA: Diagnosis not present

## 2020-07-12 DIAGNOSIS — Z1331 Encounter for screening for depression: Secondary | ICD-10-CM | POA: Diagnosis not present

## 2020-07-12 DIAGNOSIS — K921 Melena: Secondary | ICD-10-CM | POA: Diagnosis not present

## 2020-07-12 DIAGNOSIS — Z0000 Encounter for general adult medical examination without abnormal findings: Secondary | ICD-10-CM | POA: Diagnosis not present

## 2020-07-12 DIAGNOSIS — I10 Essential (primary) hypertension: Secondary | ICD-10-CM | POA: Diagnosis not present

## 2020-07-12 DIAGNOSIS — R82998 Other abnormal findings in urine: Secondary | ICD-10-CM | POA: Diagnosis not present

## 2020-07-12 DIAGNOSIS — M19049 Primary osteoarthritis, unspecified hand: Secondary | ICD-10-CM | POA: Diagnosis not present

## 2020-07-12 DIAGNOSIS — E119 Type 2 diabetes mellitus without complications: Secondary | ICD-10-CM | POA: Diagnosis not present

## 2020-07-12 DIAGNOSIS — G250 Essential tremor: Secondary | ICD-10-CM | POA: Diagnosis not present

## 2020-07-12 DIAGNOSIS — E7800 Pure hypercholesterolemia, unspecified: Secondary | ICD-10-CM | POA: Diagnosis not present

## 2020-07-12 DIAGNOSIS — E559 Vitamin D deficiency, unspecified: Secondary | ICD-10-CM | POA: Diagnosis not present

## 2020-07-12 DIAGNOSIS — H35033 Hypertensive retinopathy, bilateral: Secondary | ICD-10-CM | POA: Diagnosis not present

## 2020-07-12 DIAGNOSIS — E663 Overweight: Secondary | ICD-10-CM | POA: Diagnosis not present

## 2020-07-20 ENCOUNTER — Other Ambulatory Visit: Payer: Self-pay | Admitting: Physician Assistant

## 2020-07-20 DIAGNOSIS — R1110 Vomiting, unspecified: Secondary | ICD-10-CM

## 2020-07-20 DIAGNOSIS — Z7984 Long term (current) use of oral hypoglycemic drugs: Secondary | ICD-10-CM | POA: Diagnosis not present

## 2020-07-20 DIAGNOSIS — E119 Type 2 diabetes mellitus without complications: Secondary | ICD-10-CM | POA: Diagnosis not present

## 2020-07-20 DIAGNOSIS — R195 Other fecal abnormalities: Secondary | ICD-10-CM | POA: Diagnosis not present

## 2020-07-20 DIAGNOSIS — K5900 Constipation, unspecified: Secondary | ICD-10-CM | POA: Diagnosis not present

## 2020-07-27 ENCOUNTER — Ambulatory Visit
Admission: RE | Admit: 2020-07-27 | Discharge: 2020-07-27 | Disposition: A | Discharge status: Home / Self Care | Payer: Medicare Other | Source: Ambulatory Visit | Attending: Physician Assistant | Admitting: Physician Assistant

## 2020-07-27 ENCOUNTER — Other Ambulatory Visit: Payer: Self-pay | Admitting: Physician Assistant

## 2020-07-27 ENCOUNTER — Telehealth: Payer: Self-pay | Admitting: Cardiovascular Disease

## 2020-07-27 DIAGNOSIS — K224 Dyskinesia of esophagus: Secondary | ICD-10-CM | POA: Diagnosis not present

## 2020-07-27 DIAGNOSIS — R1110 Vomiting, unspecified: Secondary | ICD-10-CM

## 2020-07-27 NOTE — Telephone Encounter (Signed)
Follow Up:     Pt have not any episodes recently.

## 2020-07-27 NOTE — Telephone Encounter (Signed)
Pt c/o of Chest Pain: STAT if CP now or developed within 24 hours  1. Are you having CP right now? Been having episodes where it felt like indigestion  2. Are you experiencing any other symptoms (ex. SOB, nausea, vomiting, sweating)? no  3. How long have you been experiencing CP? None recently- saw his GI doctor and was told it sounded like ir might be his heart  4. Is your CP continuous or coming and going? Stayed for about 5 minutes and it did not come back  5. Have you taken Nitroglycerin? Does not have any- not sure if pt needs ti?

## 2020-07-27 NOTE — Telephone Encounter (Signed)
Spoke with patient's wife about indigestion the patient was having and she was worried it could be related to his heart.  Patient reported to his wife that he had trouble swallowing some food and felt like it got lodged in his throat and had to walk around and drink water to get the food to go down and reported pain in his chest. Patient reported pain was gone in 5 minutes and did not radiate anywhere, no sob and has not reoccurred.  Patient was having a barium swallow test and was unable to talk to this RN during the call.  RN advised that the pain sounds like it is related to his indigestion and not heart related but reassured wife this message would be sent to the provider and his nurse.  Patient is scheduled to see Dr. Acie Fredrickson later in June and advised to keep appointment.

## 2020-08-22 NOTE — Progress Notes (Signed)
Cardiology Office Note   Date:  08/23/2020   ID:  Anthony Brennan, DOB 08-07-1950, MRN [id number]  PCP:  Anthony Pao, MD  Cardiologist:   Anthony Moores, MD   Chief Complaint  Patient presents with   Chest Pain   Hyperlipidemia   Hypertension    Problem List:   1. HTN 2. Hyperlipidemia 3. DM     Anthony Brennan is a middle age gentleman with a hx of HTN and hyperlipidemia, DM.  He has done well .  June 02, 2012:  Anthony Brennan is doing well.  No CP or dyspnea.  Several dizzy spells yesterday - one episode occurred while he was sitting at his desk .  He has just started walking again this spring.     Jul 27, 2014 : Anthony Brennan is a 70 y.o. male who presents for his HTN. Walking regularly.  No CP or dyspnea   Jul 19, 2015:  Doing well.    BP is regular at home .  August 14, 2017:  Anthony Brennan is seen back today for follow-up visit.  He has a history of hypertension and hyperlipidemia.  He was last seen 2 years ago. Has had some bruising  Exercising some.    Still working in the [employer].   Had labs drawn at [facility]. Glucose was mildly elevated at 112.  Creatinine is normal at 0.8.  Sodium and potassium are normal.  Liver enzymes are normal.  White blood cell count is normal.  Apoprotein B is normal.  PSA was 1.634.  HDL is 51.  LDL 76.  Total cholesterol is 145.  Triglyceride level is 88.  August 04, 2019:  Anthony Brennan is seen today for follow-up visit for his hypertension and hyperlipidemia.  He was seen approximately 2 years ago. He brought labs with him from his primary MD  CHol is 136 Trigs = 91 HDL = 46 LDL = 72  Doing well No cp.  No dyspnea.   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   BP is a bit elevated here today .  BP is usually 120's   jUne, 22, 2022  Anthony Brennan is seen today for follow up of his HTN, HLD  Had some chest pain while at the beach last year Was eating ( too fast he said)  Severe CP , pressure  Took several deep breaths,  drank water  He  thought some food was stuck in his esophagus.  Anthony Brennan ( wife)  asked him to go to the hospital - he did not  Did see GI ( Anthony Brennan)  Barium swallow showed  small hiatal hernia  Is scheduled for EGD .  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   Labs from his primary medical doctor in April, 2022 reveals a total cholesterol of 168 Triglyceride level is 78 HDL is 70 LDL is 82.   Past Medical History:  Diagnosis Date   Chest pain    Recent episode of - stress Myoview study was normal   Diabetes mellitus    Hyperlipidemia    Hypertension    Hypertensive retinopathy    OU    Past Surgical History:  Procedure Laterality Date   APPENDECTOMY  1970   CARDIOVASCULAR STRESS TEST  02-21-2010   EF 65%   CATARACT EXTRACTION Bilateral    EYE SURGERY     HERNIA REPAIR  69 years old   RETINAL DETACHMENT SURGERY Left    S/P SB, PPV     Current Outpatient Medications  Medication Sig Dispense Refill   albuterol (VENTOLIN HFA) 108 (90 Base) MCG/ACT inhaler By Mouth Every 4 Hours PRN     aspirin 81 MG tablet Take 81 mg by mouth daily.       Cholecalciferol (VITAMIN D PO) Take 1 capsule by mouth daily.      Dulaglutide 0.75 MG/0.5ML SOPN Inject 1 Dose into the skin once a week.     empagliflozin (JARDIANCE) 25 MG TABS tablet Take 25 mg by mouth daily.     metFORMIN (GLUCOPHAGE-XR) 500 MG 24 hr tablet Take 500 mg by mouth 2 (two) times daily. 2 tablets in the morning and 1 tablet at night     pioglitazone (ACTOS) 30 MG tablet Take 1 tablet by mouth daily.     Polyethyl Glycol-Propyl Glycol 0.4-0.3 % SOLN Place 1 drop into the left eye as needed.     ramipril (ALTACE) 10 MG tablet Take 1 tablet (10 mg total) by mouth 2 (two) times daily. 180 tablet 3   rosuvastatin (CRESTOR) 40 MG tablet Take 1 tablet by mouth daily.  3   valACYclovir (VALTREX) 1000 MG tablet Take 2 tablets by mouth as needed. For fever blisters     No  current facility-administered medications for this visit.    Allergies:   Other    Social History:  The patient  reports that he has never smoked. He has never used smokeless tobacco. He reports current alcohol use.   Family History:  The patient's family history includes Diabetes in his father, paternal uncle, and another family member; Hyperlipidemia in his brother; Hypertension in his mother.    ROS:    Reviewed and current history, otherwise review of systems is negative.  Physical Exam: Blood pressure 140/88, pulse 79, height 5\' 7"  (1.702 m), weight 173 lb 9.6 oz (78.7 kg), SpO2 98 %.  GEN:  Well nourished, well developed in no acute distress HEENT: Normal NECK: No JVD; No carotid bruits LYMPHATICS: No lymphadenopathy CARDIAC: RRR , no murmurs, rubs, gallops RESPIRATORY:  Clear to auscultation without rales, wheezing or rhonchi  ABDOMEN: Soft, non-tender, non-distended MUSCULOSKELETAL:  No edema; No deformity  SKIN: Warm and dry NEUROLOGIC:  Alert and oriented x 3   EKG:       August 23, 2020:  NSR at 59.  No ST or T wave changes.   Recent Labs: No results found for requested labs within last 8760 hours.    Lipid Panel No results found for: CHOL, TRIG, HDL, CHOLHDL, VLDL, LDLCALC, LDLDIRECT    Wt Readings from Last 3 Encounters:  08/23/20 173 lb 9.6 oz (78.7 kg)  08/04/19 174 lb 12 oz (79.3 kg)  08/14/17 169 lb (76.7 kg)      Other studies Reviewed: Additional studies/ records that were reviewed today include: . Review of the above records demonstrates:    ASSESSMENT AND PLAN:   Chest pain :  he had an episode of CP about a year ago .  Pressure, while eating ,  he thought food might be stuck in his esophagus. Is not as active  No recurrence of cp  I would like to do a stress myoview for further evaluation Instructed him to go to the ER for further episodes of CP       I have encouraged him to start walking at least 3-4 times a week 2  to 3 miles each  day.  He will also work on some weight loss.  2. Hyperlipidemia - labs from Dr. Osborne Brennan look good.  cholesterol of 168 Triglyceride level is 78 HDL is 70 LDL is 82.  4.   HTN-    BP is well controlled.   Current medicines are reviewed at length with the patient today.  The patient does not have concerns regarding medicines.  The following changes have been made:  no change  Labs/ tests ordered today include:   Orders Placed This Encounter  Procedures   MYOCARDIAL PERFUSION IMAGING      Disposition:   I will see him again in 1 year.    Anthony Moores, MD  08/23/2020 1:46 PM    [facility] [address], [place], [place]  [address] Phone: [phone]; Fax: [phone]

## 2020-08-23 ENCOUNTER — Other Ambulatory Visit: Payer: Self-pay

## 2020-08-23 ENCOUNTER — Ambulatory Visit (INDEPENDENT_AMBULATORY_CARE_PROVIDER_SITE_OTHER): Payer: Medicare Other | Admitting: Cardiovascular Disease

## 2020-08-23 ENCOUNTER — Encounter: Payer: Self-pay | Admitting: Cardiovascular Disease

## 2020-08-23 VITALS — BP 140/88 | HR 79 | Ht 67.0 in | Wt 173.6 lb

## 2020-08-23 DIAGNOSIS — E782 Mixed hyperlipidemia: Secondary | ICD-10-CM | POA: Diagnosis not present

## 2020-08-23 DIAGNOSIS — R079 Chest pain, unspecified: Secondary | ICD-10-CM

## 2020-08-23 DIAGNOSIS — I10 Essential (primary) hypertension: Secondary | ICD-10-CM

## 2020-08-23 DIAGNOSIS — R072 Precordial pain: Secondary | ICD-10-CM

## 2020-08-23 NOTE — Patient Instructions (Signed)
   Medication Instructions:   Your physician recommends that you continue on your current medications as directed. Please refer to the Current Medication list given to you today.  *If you need a refill on your cardiac medications before your next appointment, please call your pharmacy*   Lab Work:  none  Testing/Procedures:  Your physician has requested that you have en exercise stress myoview. For further information please visit [email or web address]. Please follow instruction sheet, as given.    Follow-Up: At [facility], you and your health needs are our priority.  As part of our continuing mission to provide you with exceptional heart care, we have created designated Provider Care Teams.  These Care Teams include your primary Cardiologist (physician) and Advanced Practice Providers (APPs -  Physician Assistants and Nurse Practitioners) who all work together to provide you with the care you need, when you need it.  We recommend signing up for the patient portal called "MyChart".  Sign up information is provided on this After Visit Summary.  MyChart is used to connect with patients for Virtual Visits (Telemedicine).  Patients are able to view lab/test results, encounter notes, upcoming appointments, etc.  Non-urgent messages can be sent to your provider as well.   To learn more about what you can do with MyChart, go to [email or web address].    Your next appointment:   1 year(s)  The format for your next appointment:   In Person  Provider:   Mertie Moores, MD

## 2020-08-25 ENCOUNTER — Telehealth (HOSPITAL_COMMUNITY): Payer: Self-pay | Admitting: *Deleted

## 2020-08-25 NOTE — Telephone Encounter (Signed)
Patient given detailed instructions per Myocardial Perfusion Study Information Sheet for the test on 08/31/20 at 10:30. Patient notified to arrive 15 minutes early and that it is imperative to arrive on time for appointment to keep from having the test rescheduled.  If you need to cancel or reschedule your appointment, please call the office within 24 hours of your appointment. . Patient verbalized understanding.Anthony Brennan

## 2020-08-25 NOTE — Addendum Note (Signed)
Addended by: Jacinta Shoe on: 08/25/2020 10:47 AM   Modules accepted: Orders

## 2020-08-28 NOTE — Addendum Note (Signed)
Addended by: Dollene Primrose on: 08/28/2020 08:11 AM   Modules accepted: Orders

## 2020-08-28 NOTE — Addendum Note (Signed)
Addended by: Thayer Headings on: 08/28/2020 08:17 AM   Modules accepted: Orders

## 2020-08-29 ENCOUNTER — Telehealth (HOSPITAL_COMMUNITY): Payer: Self-pay

## 2020-08-29 NOTE — Telephone Encounter (Signed)
Spoke with the patient, detailed instructions given. He stated that he would be here for his test. Asked to call back with any questions. Anthony Brennan EMTP 

## 2020-08-31 ENCOUNTER — Other Ambulatory Visit: Payer: Self-pay

## 2020-08-31 ENCOUNTER — Ambulatory Visit (HOSPITAL_COMMUNITY): Payer: Medicare Other | Attending: Cardiology

## 2020-08-31 DIAGNOSIS — R072 Precordial pain: Secondary | ICD-10-CM | POA: Diagnosis not present

## 2020-08-31 LAB — MYOCARDIAL PERFUSION IMAGING
Estimated workload: 9.3 METS
Exercise duration (min): 7 min
Exercise duration (sec): 30 s
LV dias vol: 78 mL (ref 62–150)
LV sys vol: 29 mL
MPHR: 151 {beats}/min
Peak HR: 134 {beats}/min
Percent HR: 88 %
Rest HR: 90 {beats}/min
SDS: 0
SRS: 0
SSS: 0
TID: 0.85

## 2020-08-31 MED ORDER — TECHNETIUM TC 99M TETROFOSMIN IV KIT
32.4000 | PACK | Freq: Once | INTRAVENOUS | Status: AC | PRN
  Administered 2020-08-31: 32.4 via INTRAVENOUS
  Filled 2020-08-31: qty 33

## 2020-08-31 MED ORDER — TECHNETIUM TC 99M TETROFOSMIN IV KIT
10.1000 | PACK | Freq: Once | INTRAVENOUS | Status: AC | PRN
  Administered 2020-08-31: 10.1 via INTRAVENOUS
  Filled 2020-08-31: qty 11

## 2020-09-11 DIAGNOSIS — R12 Heartburn: Secondary | ICD-10-CM | POA: Diagnosis not present

## 2020-09-11 DIAGNOSIS — K2100 Gastro-esophageal reflux disease with esophagitis, without bleeding: Secondary | ICD-10-CM | POA: Diagnosis not present

## 2020-09-11 DIAGNOSIS — R1314 Dysphagia, pharyngoesophageal phase: Secondary | ICD-10-CM | POA: Diagnosis not present

## 2020-09-11 DIAGNOSIS — R195 Other fecal abnormalities: Secondary | ICD-10-CM | POA: Diagnosis not present

## 2020-09-13 ENCOUNTER — Encounter (INDEPENDENT_AMBULATORY_CARE_PROVIDER_SITE_OTHER): Payer: Medicare Other | Admitting: Ophthalmology

## 2020-09-13 ENCOUNTER — Other Ambulatory Visit: Payer: Self-pay

## 2020-09-13 DIAGNOSIS — I10 Essential (primary) hypertension: Secondary | ICD-10-CM

## 2020-09-13 DIAGNOSIS — H43811 Vitreous degeneration, right eye: Secondary | ICD-10-CM | POA: Diagnosis not present

## 2020-09-13 DIAGNOSIS — H33303 Unspecified retinal break, bilateral: Secondary | ICD-10-CM | POA: Diagnosis not present

## 2020-09-13 DIAGNOSIS — H35033 Hypertensive retinopathy, bilateral: Secondary | ICD-10-CM

## 2020-10-17 ENCOUNTER — Other Ambulatory Visit: Payer: Self-pay

## 2020-10-17 ENCOUNTER — Encounter (INDEPENDENT_AMBULATORY_CARE_PROVIDER_SITE_OTHER): Payer: Medicare Other | Admitting: Ophthalmology

## 2020-10-17 DIAGNOSIS — H34811 Central retinal vein occlusion, right eye, with macular edema: Secondary | ICD-10-CM

## 2020-10-17 DIAGNOSIS — I10 Essential (primary) hypertension: Secondary | ICD-10-CM | POA: Diagnosis not present

## 2020-10-17 DIAGNOSIS — H338 Other retinal detachments: Secondary | ICD-10-CM

## 2020-10-17 DIAGNOSIS — H33301 Unspecified retinal break, right eye: Secondary | ICD-10-CM | POA: Diagnosis not present

## 2020-10-17 DIAGNOSIS — H43812 Vitreous degeneration, left eye: Secondary | ICD-10-CM

## 2020-10-17 DIAGNOSIS — H35031 Hypertensive retinopathy, right eye: Secondary | ICD-10-CM | POA: Diagnosis not present

## 2020-10-18 DIAGNOSIS — E119 Type 2 diabetes mellitus without complications: Secondary | ICD-10-CM | POA: Diagnosis not present

## 2020-10-18 DIAGNOSIS — I10 Essential (primary) hypertension: Secondary | ICD-10-CM | POA: Diagnosis not present

## 2020-10-18 DIAGNOSIS — H348112 Central retinal vein occlusion, right eye, stable: Secondary | ICD-10-CM | POA: Diagnosis not present

## 2020-10-23 ENCOUNTER — Encounter (HOSPITAL_COMMUNITY): Payer: Self-pay

## 2020-10-23 ENCOUNTER — Observation Stay (HOSPITAL_COMMUNITY)
Admission: EM | Admit: 2020-10-23 | Discharge: 2020-10-24 | Disposition: A | Discharge status: Home / Self Care | Payer: Medicare Other | Attending: Internal Medicine | Admitting: Internal Medicine

## 2020-10-23 ENCOUNTER — Emergency Department (HOSPITAL_COMMUNITY): Payer: Medicare Other

## 2020-10-23 ENCOUNTER — Other Ambulatory Visit: Payer: Self-pay

## 2020-10-23 DIAGNOSIS — Z7982 Long term (current) use of aspirin: Secondary | ICD-10-CM | POA: Diagnosis not present

## 2020-10-23 DIAGNOSIS — Z79899 Other long term (current) drug therapy: Secondary | ICD-10-CM | POA: Insufficient documentation

## 2020-10-23 DIAGNOSIS — R0789 Other chest pain: Secondary | ICD-10-CM | POA: Diagnosis not present

## 2020-10-23 DIAGNOSIS — I6523 Occlusion and stenosis of bilateral carotid arteries: Secondary | ICD-10-CM | POA: Diagnosis not present

## 2020-10-23 DIAGNOSIS — I10 Essential (primary) hypertension: Secondary | ICD-10-CM | POA: Diagnosis present

## 2020-10-23 DIAGNOSIS — Z961 Presence of intraocular lens: Secondary | ICD-10-CM | POA: Diagnosis not present

## 2020-10-23 DIAGNOSIS — E785 Hyperlipidemia, unspecified: Secondary | ICD-10-CM | POA: Diagnosis present

## 2020-10-23 DIAGNOSIS — Z20822 Contact with and (suspected) exposure to covid-19: Secondary | ICD-10-CM | POA: Insufficient documentation

## 2020-10-23 DIAGNOSIS — H5461 Unqualified visual loss, right eye, normal vision left eye: Principal | ICD-10-CM | POA: Diagnosis present

## 2020-10-23 DIAGNOSIS — E119 Type 2 diabetes mellitus without complications: Secondary | ICD-10-CM | POA: Diagnosis not present

## 2020-10-23 DIAGNOSIS — Z8669 Personal history of other diseases of the nervous system and sense organs: Secondary | ICD-10-CM | POA: Diagnosis not present

## 2020-10-23 DIAGNOSIS — I672 Cerebral atherosclerosis: Secondary | ICD-10-CM | POA: Diagnosis not present

## 2020-10-23 DIAGNOSIS — H539 Unspecified visual disturbance: Secondary | ICD-10-CM | POA: Diagnosis present

## 2020-10-23 DIAGNOSIS — R29818 Other symptoms and signs involving the nervous system: Secondary | ICD-10-CM | POA: Insufficient documentation

## 2020-10-23 DIAGNOSIS — Z7984 Long term (current) use of oral hypoglycemic drugs: Secondary | ICD-10-CM | POA: Diagnosis not present

## 2020-10-23 DIAGNOSIS — I6503 Occlusion and stenosis of bilateral vertebral arteries: Secondary | ICD-10-CM | POA: Diagnosis not present

## 2020-10-23 DIAGNOSIS — H4311 Vitreous hemorrhage, right eye: Secondary | ICD-10-CM | POA: Diagnosis not present

## 2020-10-23 DIAGNOSIS — H3411 Central retinal artery occlusion, right eye: Secondary | ICD-10-CM | POA: Diagnosis not present

## 2020-10-23 DIAGNOSIS — H33311 Horseshoe tear of retina without detachment, right eye: Secondary | ICD-10-CM | POA: Diagnosis not present

## 2020-10-23 LAB — DIFFERENTIAL
Abs Immature Granulocytes: 0.03 10*3/uL (ref 0.00–0.07)
Basophils Absolute: 0 10*3/uL (ref 0.0–0.1)
Basophils Relative: 1 %
Eosinophils Absolute: 0.1 10*3/uL (ref 0.0–0.5)
Eosinophils Relative: 2 %
Immature Granulocytes: 1 %
Lymphocytes Relative: 29 %
Lymphs Abs: 1.9 10*3/uL (ref 0.7–4.0)
Monocytes Absolute: 0.6 10*3/uL (ref 0.1–1.0)
Monocytes Relative: 10 %
Neutro Abs: 3.9 10*3/uL (ref 1.7–7.7)
Neutrophils Relative %: 57 %

## 2020-10-23 LAB — COMPREHENSIVE METABOLIC PANEL
ALT: 18 U/L (ref 0–44)
AST: 17 U/L (ref 15–41)
Albumin: 3.9 g/dL (ref 3.5–5.0)
Alkaline Phosphatase: 33 U/L — ABNORMAL LOW (ref 38–126)
Anion gap: 11 (ref 5–15)
BUN: 22 mg/dL (ref 8–23)
CO2: 25 mmol/L (ref 22–32)
Calcium: 9.5 mg/dL (ref 8.9–10.3)
Chloride: 102 mmol/L (ref 98–111)
Creatinine, Ser: 1.05 mg/dL (ref 0.61–1.24)
GFR, Estimated: >60 mL/min (ref >60)
Glucose, Bld: 186 mg/dL — ABNORMAL HIGH (ref 70–99)
Potassium: 4.3 mmol/L (ref 3.5–5.1)
Sodium: 138 mmol/L (ref 135–145)
Total Bilirubin: 0.6 mg/dL (ref 0.3–1.2)
Total Protein: 6.6 g/dL (ref 6.5–8.1)

## 2020-10-23 LAB — CBC
HCT: 44.6 % (ref 39.0–52.0)
Hemoglobin: 14.6 g/dL (ref 13.0–17.0)
MCH: 31.4 pg (ref 26.0–34.0)
MCHC: 32.7 g/dL (ref 30.0–36.0)
MCV: 95.9 fL (ref 80.0–100.0)
Platelets: 233 10*3/uL (ref 150–400)
RBC: 4.65 MIL/uL (ref 4.22–5.81)
RDW: 13 % (ref 11.5–15.5)
WBC: 6.6 10*3/uL (ref 4.0–10.5)
nRBC: 0 % (ref 0.0–0.2)

## 2020-10-23 LAB — APTT: aPTT: 27 seconds (ref 24–36)

## 2020-10-23 LAB — PROTIME-INR
INR: 0.9 (ref 0.8–1.2)
Prothrombin Time: 12.5 seconds (ref 11.4–15.2)

## 2020-10-23 MED ORDER — SODIUM CHLORIDE 0.9% FLUSH: 3.0000 mL | Freq: Once | INTRAVENOUS | Status: DC

## 2020-10-23 NOTE — ED Triage Notes (Signed)
Patient complains of being sent for neurology work-up for possible stroke after having vision changes the past 5-6 days. Patient with no weakness and has been seen by ophthalmologist x 2 for same

## 2020-10-23 NOTE — ED Provider Notes (Signed)
Emergency Medicine Provider Triage Evaluation Note  Anthony Brennan , a 70 y.o. male  was evaluated in triage.  Pt complains of vision changes. He states that he has been having changes in his right eye for one week.  He was seen by [facility] who was concerned for stroke.  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   Review of Systems  Positive: Vision changes Negative: Numbness, weakness or slurred speech.   Physical Exam  BP (!) 142/66 (BP Location: Right Arm)   Pulse 87   Temp 98.7 F (37.1 C) (Oral)   Resp 16   SpO2 97%  Gen:   Awake, no distress   Resp:  Normal effort  MSK:   Moves extremities without difficulty  Other:  Moves all 4 extremities spontaneously.  Speech is not slurred  Medical Decision Making  Medically screening exam initiated at 3:17 PM.  Appropriate orders placed.  [name] was informed that the remainder of the evaluation will be completed by another provider, this initial triage assessment does not replace that evaluation, and the importance of remaining in the ED until their evaluation is complete.  Note: Portions of this report may have been transcribed using voice recognition software. Every effort was made to ensure accuracy; however, inadvertent computerized transcription errors may be present    Lorin Glass, PA-C 10/23/20 1519    Tegeler, Gwenyth Allegra, MD 10/23/20 [phone]

## 2020-10-23 NOTE — ED Provider Notes (Signed)
[facility] EMERGENCY DEPARTMENT Provider Note   CSN: [id number] Arrival date & time: 10/23/20  1450     History No chief complaint on file.   Anthony Brennan is a 70 y.o. male.  The history is provided by the patient, the spouse and medical records.  Anthony Brennan is a 70 y.o. male who presents to the Emergency Department complaining of vision problem. A week ago Friday he developed difficulty with vision in his right eye. 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 On Tuesday he saw Dr. 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 He states that since that time his symptoms have been changing a little bit. He did have an eye injection on that day. He does have vision loss in his lower central visual fields. On Tuesday he saw Dr. Loren Racer PA and his metformin was increased to 2000 mg daily and he was also started on additional medication for blood pressure control. Today he went to the [facility] for a second opinion. There the doctor was concern for possible central retinal artery occlusion and was referred to the emergency department for further evaluation.  Takes 81 mg aspirin daily Past Medical History:  Diagnosis Date   Chest pain    Recent episode of - stress Myoview study was normal   Diabetes mellitus    Hyperlipidemia    Hypertension    Hypertensive retinopathy    OU    Patient Active Problem List   Diagnosis Date Noted   Vision loss of right eye 10/24/2020   Diabetes mellitus type 2 in nonobese ([place]) 10/24/2020   Hypertension 09/27/2010   Hyperlipidemia 09/27/2010   Chest pain of uncertain etiology [id number]    Past Surgical History:  Procedure Laterality Date   APPENDECTOMY  1970   CARDIOVASCULAR STRESS TEST  02-21-2010   EF 65%   CATARACT EXTRACTION Bilateral    EYE SURGERY     HERNIA REPAIR  70 years old   RETINAL DETACHMENT SURGERY Left    S/P SB, PPV       Family History  Problem  Relation Age of Onset   Hypertension Mother    Diabetes Father    Hyperlipidemia Brother    Diabetes Paternal Uncle    Diabetes Other     Social History   Tobacco Use   Smoking status: Never   Smokeless tobacco: Never  Substance Use Topics   Alcohol use: Yes    Comment: Only Socially    Home Medications Prior to Admission medications   Medication Sig Start Date End Date Taking? Authorizing Provider  acetaminophen (TYLENOL) 325 MG tablet Take 650 mg by mouth every 6 (six) hours as needed for moderate pain.   Yes [provider]  albuterol (VENTOLIN HFA) 108 (90 Base) MCG/ACT inhaler Inhale 1-2 puffs into the lungs every 4 (four) hours as needed for shortness of breath or wheezing. 07/02/20  Yes [provider]  aspirin 81 MG tablet Take 81 mg by mouth daily.     Yes [provider]  Cholecalciferol (VITAMIN D PO) Take 1 capsule by mouth daily.    Yes [provider]  Dulaglutide 0.75 MG/0.5ML SOPN Inject 0.75 mg into the skin every Monday.   Yes [provider]  empagliflozin (JARDIANCE) 25 MG TABS tablet Take 25 mg by mouth daily.   Yes [provider]  metFORMIN (GLUCOPHAGE-XR) 500 MG 24 hr tablet Take 1,000 mg by mouth 2 (  two) times daily.   Yes [provider]  pioglitazone (ACTOS) 30 MG tablet Take 1 tablet by mouth daily. 07/25/19  Yes [provider]  Polyethyl Glycol-Propyl Glycol 0.4-0.3 % SOLN Place 1 drop into the left eye as needed.   Yes [provider]  ramipril (ALTACE) 10 MG tablet Take 1 tablet (10 mg total) by mouth 2 (two) times daily. 03/25/11  Yes Nahser, Wonda Cheng, MD  rosuvastatin (CRESTOR) 40 MG tablet Take 40 mg by mouth at bedtime. 06/04/17  Yes [provider]  valACYclovir (VALTREX) 1000 MG tablet Take 2,000 mg by mouth as needed (fever blister). 09/01/18  Yes [provider]  olmesartan (BENICAR) 40 MG tablet Take 40 mg by mouth daily. 10/18/20   [provider]    Allergies    Other  Review of Systems   Review of Systems  All other systems reviewed and are negative.  Physical Exam Updated Vital Signs BP (!) 156/73   Pulse 78   Temp 98.7 F (37.1 C) (Oral)   Resp 16   Ht '5\' 7"'$  (1.702 m)   Wt 79.4 kg   SpO2 97%   BMI 27.41 kg/m   Physical Exam Vitals and nursing note reviewed.  Constitutional:      Appearance: He is well-developed.  HENT:     Head: Normocephalic and atraumatic.     Comments: Chronic changes to the left cornea. Right pupils midsize and reactive. EOM I. There is visual field deficit to the mid central visual field. Cardiovascular:     Rate and Rhythm: Normal rate and regular rhythm.     Heart sounds: No murmur heard. Pulmonary:     Effort: Pulmonary effort is normal. No respiratory distress.     Breath sounds: Normal breath sounds.  Abdominal:     Palpations: Abdomen is soft.     Tenderness: There is no abdominal tenderness. There is no guarding or rebound.  Musculoskeletal:        General: No swelling or tenderness.  Skin:    General: Skin is warm and dry.  Neurological:     Mental Status: He is alert and oriented to person, place, and time.     Comments: No asymmetry of facial movements. Five out of five strength in all four extremities with sensation to light touch intact in all four extremities  Psychiatric:        Behavior: Behavior normal.    ED Results / Procedures / Treatments   Labs (all labs ordered are listed, but only abnormal results are displayed) Labs Reviewed  COMPREHENSIVE METABOLIC PANEL - Abnormal; Notable for the following components:      Result Value   Glucose, Bld 186 (*)    Alkaline Phosphatase 33 (*)    All other components within normal limits  SARS CORONAVIRUS 2 (TAT 6-24 HRS)  PROTIME-INR  APTT  CBC  DIFFERENTIAL  HIV ANTIBODY (ROUTINE TESTING W REFLEX)  HEMOGLOBIN A1C  LIPID PANEL  CBC  CREATININE, SERUM    EKG EKG Interpretation  Date/Time:  Monday October 23 2020 15:11:54 EDT Ventricular Rate:  85 PR Interval:  158 QRS Duration: 84 QT Interval:  352 QTC Calculation: 418 R Axis:   80 Text Interpretation: Normal sinus rhythm Normal ECG Confirmed by Quintella Reichert [phone]) on 10/24/2020 12:54:19 AM  Radiology CT Angio Head W/Cm &/Or Wo Cm  Result Date: 10/24/2020 CLINICAL DATA:  Initial evaluation for neuro deficit, stroke suspected. EXAM: CT ANGIOGRAPHY HEAD AND NECK TECHNIQUE:  Multidetector CT imaging of the head and neck was performed using the standard protocol during bolus administration of intravenous contrast. Multiplanar CT image reconstructions and MIPs were obtained to evaluate the vascular anatomy. Carotid stenosis measurements (when applicable) are obtained utilizing NASCET criteria, using the distal internal carotid diameter as the denominator. CONTRAST:  49m OMNIPAQUE IOHEXOL 350 MG/ML SOLN COMPARISON:  CT from 10/23/2020. FINDINGS: CTA NECK FINDINGS Aortic arch: Visualized aortic arch normal caliber with normal branch pattern. Mild atheromatous change about the arch and origin of the great vessels without significant stenosis. Right carotid system: Right CCA patent from its origin to the bifurcation without stenosis. Mixed eccentric plaque about the right carotid bulb without significant stenosis. Multifocal irregularity about the mid-distal cervical right ICA could reflect atherosclerotic change versus mild changes of FMD. No significant stenosis. Left carotid system: Left CCA patent from its origin to the bifurcation without stenosis. Scattered eccentric plaque about the left carotid bulb/proximal left ICA with no more than mild 30% stenosis by NASCET criteria. Multifocal irregularity involving the mid-distal cervical left ICA could be atherosclerotic in nature or reflect changes of mild FMD. No other significant stenosis. Vertebral arteries: Both vertebral arteries arise from subclavian arteries. No proximal subclavian artery stenosis.  Atheromatous change at the origins of both vertebral arteries with associated moderate approximate 50% stenoses. Vertebral arteries otherwise patent without stenosis, dissection or occlusion. Skeleton: No visible acute osseous finding. No discrete or worrisome osseous lesions. Moderate spondylosis present at C4-5 through C6-7. Other neck: No other acute soft tissue abnormality within the neck. No mass or adenopathy. Upper chest: Visualized upper chest demonstrates no other acute finding. Review of the MIP images confirms the above findings CTA HEAD FINDINGS Anterior circulation: Petrous, cavernous, and supraclinoid segments of both internal carotid arteries widely patent without stenosis. A1 segments patent bilaterally. Normal anterior communicating artery complex. Anterior cerebral arteries patent to their distal aspects without stenosis. No M1 stenosis or occlusion. Normal MCA bifurcations. Distal MCA branches well perfused and symmetric. Posterior circulation: Both vertebral arteries patent to the vertebrobasilar junction without stenosis. Left vertebral artery dominant. Both PICA origins patent and within normal limits. Basilar patent to its distal aspect without stenosis. Superior cerebellar arteries patent bilaterally. Both PCAs primarily supplied via the basilar well perfused to their distal aspects. Venous sinuses: Grossly patent allowing for timing the contrast bolus. Anatomic variants: None significant.  No aneurysm. Review of the MIP images confirms the above findings IMPRESSION: 1. Negative CTA for large vessel occlusion. 2. Atheromatous change at the origins of both vertebral arteries with associated moderate approximate 50% stenoses. 3. Additional mild atheromatous change about the aortic arch and carotid bifurcations without hemodynamically significant stenosis. 4. Subtle multifocal irregularity about the mid-distal cervical ICAs bilaterally, which could reflect atherosclerotic change versus mild  changes of FMD. Electronically Signed   By: BJeannine BogaM.D.   On: 10/24/2020 02:58   CT HEAD WO CONTRAST  Result Date: 10/23/2020 CLINICAL DATA:  Neuro deficit, acute, stroke suspected. Vision changes in the right eye EXAM: CT HEAD WITHOUT CONTRAST TECHNIQUE: Contiguous axial images were obtained from the base of the skull through the vertex without intravenous contrast. COMPARISON:  None. FINDINGS: Brain: No evidence of acute infarction, hemorrhage, hydrocephalus, extra-axial collection or mass lesion/mass effect. Nonspecific basal ganglia calcifications. Vascular: Atherosclerotic calcifications involving the large vessels of the skull base. No unexpected hyperdense vessel. Skull: Normal. Negative for fracture or focal lesion. Sinuses/Orbits: Postoperative changes to the left lobe. Right orbital structures appear unremarkable by CT. Paranasal  sinuses and mastoid air cells are clear. Other: None. IMPRESSION: No acute intracranial findings. Electronically Signed   By: Davina Poke D.O.   On: 10/23/2020 16:54   CT Angio Neck W and/or Wo Contrast  Result Date: 10/24/2020 CLINICAL DATA:  Initial evaluation for neuro deficit, stroke suspected. EXAM: CT ANGIOGRAPHY HEAD AND NECK TECHNIQUE: Multidetector CT imaging of the head and neck was performed using the standard protocol during bolus administration of intravenous contrast. Multiplanar CT image reconstructions and MIPs were obtained to evaluate the vascular anatomy. Carotid stenosis measurements (when applicable) are obtained utilizing NASCET criteria, using the distal internal carotid diameter as the denominator. CONTRAST:  70m OMNIPAQUE IOHEXOL 350 MG/ML SOLN COMPARISON:  CT from 10/23/2020. FINDINGS: CTA NECK FINDINGS Aortic arch: Visualized aortic arch normal caliber with normal branch pattern. Mild atheromatous change about the arch and origin of the great vessels without significant stenosis. Right carotid system: Right CCA patent from its  origin to the bifurcation without stenosis. Mixed eccentric plaque about the right carotid bulb without significant stenosis. Multifocal irregularity about the mid-distal cervical right ICA could reflect atherosclerotic change versus mild changes of FMD. No significant stenosis. Left carotid system: Left CCA patent from its origin to the bifurcation without stenosis. Scattered eccentric plaque about the left carotid bulb/proximal left ICA with no more than mild 30% stenosis by NASCET criteria. Multifocal irregularity involving the mid-distal cervical left ICA could be atherosclerotic in nature or reflect changes of mild FMD. No other significant stenosis. Vertebral arteries: Both vertebral arteries arise from subclavian arteries. No proximal subclavian artery stenosis. Atheromatous change at the origins of both vertebral arteries with associated moderate approximate 50% stenoses. Vertebral arteries otherwise patent without stenosis, dissection or occlusion. Skeleton: No visible acute osseous finding. No discrete or worrisome osseous lesions. Moderate spondylosis present at C4-5 through C6-7. Other neck: No other acute soft tissue abnormality within the neck. No mass or adenopathy. Upper chest: Visualized upper chest demonstrates no other acute finding. Review of the MIP images confirms the above findings CTA HEAD FINDINGS Anterior circulation: Petrous, cavernous, and supraclinoid segments of both internal carotid arteries widely patent without stenosis. A1 segments patent bilaterally. Normal anterior communicating artery complex. Anterior cerebral arteries patent to their distal aspects without stenosis. No M1 stenosis or occlusion. Normal MCA bifurcations. Distal MCA branches well perfused and symmetric. Posterior circulation: Both vertebral arteries patent to the vertebrobasilar junction without stenosis. Left vertebral artery dominant. Both PICA origins patent and within normal limits. Basilar patent to its  distal aspect without stenosis. Superior cerebellar arteries patent bilaterally. Both PCAs primarily supplied via the basilar well perfused to their distal aspects. Venous sinuses: Grossly patent allowing for timing the contrast bolus. Anatomic variants: None significant.  No aneurysm. Review of the MIP images confirms the above findings IMPRESSION: 1. Negative CTA for large vessel occlusion. 2. Atheromatous change at the origins of both vertebral arteries with associated moderate approximate 50% stenoses. 3. Additional mild atheromatous change about the aortic arch and carotid bifurcations without hemodynamically significant stenosis. 4. Subtle multifocal irregularity about the mid-distal cervical ICAs bilaterally, which could reflect atherosclerotic change versus mild changes of FMD. Electronically Signed   By: BJeannine BogaM.D.   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 New right side vision changes in the lower half of the visual field, questionable diagnosis of with central retinal vein occlusion versus  infarct. EXAM: MRI HEAD WITHOUT CONTRAST TECHNIQUE: Multiplanar, multiecho pulse sequences of the brain and surrounding structures were obtained without intravenous contrast. COMPARISON:  CT head yesterday, and CTA head and neck 0153 hours today. FINDINGS: Brain: No restricted diffusion to suggest acute infarction. No midline shift, mass effect, evidence of mass lesion, ventriculomegaly, extra-axial collection or acute intracranial hemorrhage. Cervicomedullary junction and pituitary are within normal limits. Very mild for age nonspecific cerebral white matter scattered T2 and FLAIR hyperintensity, mostly in the periatrial regions. No cortical encephalomalacia. No chronic cerebral blood products identified. Deep gray nuclei, brainstem and cerebellum are within normal  limits. Vascular: Major intracranial vascular flow voids are preserved. Skull and upper cervical spine: Negative for age visible cervical spine. Visualized bone marrow signal is within normal limits. Sinuses/Orbits: Normal suprasellar cistern. Optic chiasm appears unremarkable. Noncontrast cavernous sinus is unremarkable. 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 There is also a small chronic left orbital floor fracture. Prior cataract surgery on the right. Right orbits soft tissues otherwise unremarkable. Paranasal Visualized paranasal sinuses and mastoids are stable and well aerated. Other: Visible internal auditory structures appear normal. Negative visible scalp and face. IMPRESSION: 1. No acute intracranial abnormality with normal for age non-contrast MRI appearance of the brain. 2. Negative routine MRI appearance of the right orbit. Left-side postoperative changes and phthisis bulbi. Electronically Signed   By: Genevie Ann M.D.   On: 10/24/2020 06:08    Procedures Procedures   Medications Ordered in ED Medications  aspirin chewable tablet 81 mg (has no administration in time range)  irbesartan (AVAPRO) tablet 300 mg (has no administration in time range)  rosuvastatin (CRESTOR) tablet 40 mg (has no administration in time range)  albuterol (PROVENTIL) (2.5 MG/3ML) 0.083% nebulizer solution 2.5 mg (has no administration in time range)   stroke: mapping our early stages of recovery book (has no administration in time range)  acetaminophen (TYLENOL) tablet 650 mg (has no administration in time range)    Or  acetaminophen (TYLENOL) 160 MG/5ML solution 650 mg (has no administration in time range)    Or  acetaminophen (TYLENOL) suppository 650 mg (has no administration in time range)  enoxaparin (LOVENOX) injection 40 mg (has no administration in time range)  insulin aspart (novoLOG) injection 0-9 Units (has no administration in time range)  iohexol (OMNIPAQUE) 350 MG/ML injection 75 mL (75  mLs Intravenous Contrast Given 10/24/20 0143)    ED Course  I have reviewed the triage vital signs and the nursing notes.  Pertinent labs & imaging results that were available during my care of the patient were reviewed by me and considered in my medical decision making (see chart for details).    MDM Rules/Calculators/A&P                          patient referred to the emergency department from ophthalmology for evaluation of visual field deficit in the right eye. Recommendation per ophthalmology for CVA evaluation due to concern for possible CRAO. Discussed with Dr. Cheral Marker with neurology, who will see the patient consult. Medicine consulted for admission for ongoing workup.  Final Clinical Impression(s) / ED Diagnoses Final diagnoses:  None    Rx / DC Orders ED Discharge Orders     None        Quintella Reichert, MD 10/24/20 [phone]

## 2020-10-24 ENCOUNTER — Observation Stay (HOSPITAL_COMMUNITY): Payer: Medicare Other

## 2020-10-24 ENCOUNTER — Emergency Department (HOSPITAL_COMMUNITY): Payer: Medicare Other

## 2020-10-24 ENCOUNTER — Encounter (HOSPITAL_COMMUNITY): Payer: Self-pay | Admitting: Internal Medicine

## 2020-10-24 ENCOUNTER — Observation Stay (HOSPITAL_BASED_OUTPATIENT_CLINIC_OR_DEPARTMENT_OTHER): Payer: Medicare Other

## 2020-10-24 DIAGNOSIS — E782 Mixed hyperlipidemia: Secondary | ICD-10-CM

## 2020-10-24 DIAGNOSIS — I10 Essential (primary) hypertension: Secondary | ICD-10-CM | POA: Diagnosis not present

## 2020-10-24 DIAGNOSIS — E119 Type 2 diabetes mellitus without complications: Secondary | ICD-10-CM

## 2020-10-24 DIAGNOSIS — H348112 Central retinal vein occlusion, right eye, stable: Secondary | ICD-10-CM

## 2020-10-24 DIAGNOSIS — H34231 Retinal artery branch occlusion, right eye: Secondary | ICD-10-CM | POA: Diagnosis not present

## 2020-10-24 DIAGNOSIS — I672 Cerebral atherosclerosis: Secondary | ICD-10-CM | POA: Diagnosis not present

## 2020-10-24 DIAGNOSIS — H5461 Unqualified visual loss, right eye, normal vision left eye: Secondary | ICD-10-CM | POA: Diagnosis present

## 2020-10-24 DIAGNOSIS — I6523 Occlusion and stenosis of bilateral carotid arteries: Secondary | ICD-10-CM | POA: Diagnosis not present

## 2020-10-24 DIAGNOSIS — S0232XA Fracture of orbital floor, left side, initial encounter for closed fracture: Secondary | ICD-10-CM | POA: Diagnosis not present

## 2020-10-24 DIAGNOSIS — R29818 Other symptoms and signs involving the nervous system: Secondary | ICD-10-CM | POA: Diagnosis not present

## 2020-10-24 DIAGNOSIS — H5462 Unqualified visual loss, left eye, normal vision right eye: Secondary | ICD-10-CM | POA: Diagnosis not present

## 2020-10-24 DIAGNOSIS — H53132 Sudden visual loss, left eye: Secondary | ICD-10-CM | POA: Diagnosis not present

## 2020-10-24 DIAGNOSIS — H3320 Serous retinal detachment, unspecified eye: Secondary | ICD-10-CM | POA: Diagnosis not present

## 2020-10-24 DIAGNOSIS — I6503 Occlusion and stenosis of bilateral vertebral arteries: Secondary | ICD-10-CM | POA: Diagnosis not present

## 2020-10-24 LAB — LIPID PANEL
Cholesterol: 180 mg/dL (ref 0–200)
HDL: 64 mg/dL (ref >40)
LDL Cholesterol: 89 mg/dL (ref 0–99)
Total CHOL/HDL Ratio: 2.8 RATIO
Triglycerides: 137 mg/dL (ref <150)
VLDL: 27 mg/dL (ref 0–40)

## 2020-10-24 LAB — CREATININE, SERUM
Creatinine, Ser: 0.88 mg/dL (ref 0.61–1.24)
GFR, Estimated: >60 mL/min (ref >60)

## 2020-10-24 LAB — CBC
HCT: 45.7 % (ref 39.0–52.0)
Hemoglobin: 15 g/dL (ref 13.0–17.0)
MCH: 31.4 pg (ref 26.0–34.0)
MCHC: 32.8 g/dL (ref 30.0–36.0)
MCV: 95.8 fL (ref 80.0–100.0)
Platelets: 233 10*3/uL (ref 150–400)
RBC: 4.77 MIL/uL (ref 4.22–5.81)
RDW: 13.1 % (ref 11.5–15.5)
WBC: 6.6 10*3/uL (ref 4.0–10.5)
nRBC: 0 % (ref 0.0–0.2)

## 2020-10-24 LAB — HIV ANTIBODY (ROUTINE TESTING W REFLEX): HIV Screen 4th Generation wRfx: NONREACTIVE

## 2020-10-24 LAB — ECHOCARDIOGRAM COMPLETE
Area-P 1/2: 4.21 cm2
Height: 67 in
S' Lateral: 3.4 cm
Weight: 2800 oz

## 2020-10-24 LAB — SARS CORONAVIRUS 2 (TAT 6-24 HRS): SARS Coronavirus 2: NEGATIVE

## 2020-10-24 LAB — CBG MONITORING, ED
Glucose-Capillary: 199 mg/dL — ABNORMAL HIGH (ref 70–99)
Glucose-Capillary: 164 mg/dL — ABNORMAL HIGH (ref 70–99)

## 2020-10-24 LAB — HEMOGLOBIN A1C
Hgb A1c MFr Bld: 7.4 % — ABNORMAL HIGH (ref 4.8–5.6)
Mean Plasma Glucose: 165.68 mg/dL

## 2020-10-24 LAB — C-REACTIVE PROTEIN: CRP: <0.5 mg/dL (ref <1.0)

## 2020-10-24 LAB — SEDIMENTATION RATE: Sed Rate: 4 mm/h (ref 0–16)

## 2020-10-24 MED ORDER — ACETAMINOPHEN 650 MG RE SUPP: 650.0000 mg | RECTAL | Status: DC | PRN

## 2020-10-24 MED ORDER — STROKE: EARLY STAGES OF RECOVERY BOOK: Freq: Once | Status: DC

## 2020-10-24 MED ORDER — INSULIN ASPART 100 UNIT/ML IJ SOLN: 0.0000 [IU] | Freq: Three times a day (TID) | INTRAMUSCULAR | Status: DC

## 2020-10-24 MED ORDER — CLOPIDOGREL BISULFATE 75 MG PO TABS
75.0000 mg | ORAL_TABLET | Freq: Every day | ORAL | Status: DC
  Filled 2020-10-24: qty 1

## 2020-10-24 MED ORDER — ACETAMINOPHEN 325 MG PO TABS: 650.0000 mg | ORAL_TABLET | ORAL | Status: DC | PRN

## 2020-10-24 MED ORDER — IOHEXOL 350 MG/ML SOLN
75.0000 mL | Freq: Once | INTRAVENOUS | Status: AC | PRN
  Administered 2020-10-24: 75 mL via INTRAVENOUS

## 2020-10-24 MED ORDER — ACETAMINOPHEN 160 MG/5ML PO SOLN: 650.0000 mg | ORAL | Status: DC | PRN

## 2020-10-24 MED ORDER — IRBESARTAN 300 MG PO TABS
300.0000 mg | ORAL_TABLET | Freq: Every day | ORAL | Status: DC
  Filled 2020-10-24: qty 1

## 2020-10-24 MED ORDER — METFORMIN HCL ER 500 MG PO TB24: 1000.0000 mg | ORAL_TABLET | Freq: Two times a day (BID) | ORAL | Status: AC

## 2020-10-24 MED ORDER — ENOXAPARIN SODIUM 40 MG/0.4ML IJ SOSY: 40.0000 mg | PREFILLED_SYRINGE | Freq: Every day | INTRAMUSCULAR | Status: DC

## 2020-10-24 MED ORDER — ROSUVASTATIN CALCIUM 20 MG PO TABS: 40.0000 mg | ORAL_TABLET | Freq: Every day | ORAL | Status: DC

## 2020-10-24 MED ORDER — ALBUTEROL SULFATE (2.5 MG/3ML) 0.083% IN NEBU: 2.5000 mg | INHALATION_SOLUTION | RESPIRATORY_TRACT | Status: DC | PRN

## 2020-10-24 MED ORDER — ASPIRIN 81 MG PO CHEW
81.0000 mg | CHEWABLE_TABLET | Freq: Every day | ORAL | Status: DC
  Administered 2020-10-24: 81 mg via ORAL
  Filled 2020-10-24: qty 1

## 2020-10-24 NOTE — H&P (Signed)
History and Physical    Anthony Brennan [id number] DOB: January 03, 1951 DOA: 10/23/2020  PCP: Haywood Pao, MD  Patient coming from: Home.  Chief Complaint: Right vision disturbance.  HPI: Anthony Brennan is a 70 y.o. 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  Patient had gone to ophthalmologist yesterday at [facility] for a second opinion and was told that patient will need stroke work-up and was referred to the ER.  Not sure if patient had a central retinal artery or retinal vein or vein occlusion as per the note from the ophthalmologist from the [facility].  Patient denies any weakness of the extremities or any difficulty speaking swallowing and was not having any facial asymmetry.  ED Course: In the ER patient had a CT head and CT angiogram of the head and neck which did not show any large vessel occlusion.  Neurologist on-call was consulted.  Patient passed swallow.  Lab work was largely unremarkable.  EKG shows normal sinus rhythm.  COVID test is pending.  Review of Systems: As per HPI, rest all negative.   Past Medical History:  Diagnosis Date   Chest pain    Recent episode of - stress Myoview study was normal   Diabetes mellitus    Hyperlipidemia    Hypertension    Hypertensive retinopathy    OU    Past Surgical History:  Procedure Laterality Date   APPENDECTOMY  1970   CARDIOVASCULAR STRESS TEST  02-21-2010   EF 65%   CATARACT EXTRACTION Bilateral    EYE SURGERY     HERNIA REPAIR  70 years old   RETINAL DETACHMENT SURGERY Left    S/P SB, PPV     reports that he has never smoked. He has never used smokeless tobacco. He reports current  alcohol use. No history on file for drug use.  Allergies  Allergen Reactions   Other     Steroid eyedrop    Family History  Problem Relation Age of Onset   Hypertension Mother    Diabetes Father    Hyperlipidemia Brother    Diabetes Paternal Uncle    Diabetes Other     Prior to Admission medications   Medication Sig Start Date End Date Taking? Authorizing Provider  acetaminophen (TYLENOL) 325 MG tablet Take 650 mg by mouth every 6 (six) hours as needed for moderate pain.   Yes [provider]  albuterol (VENTOLIN HFA) 108 (90 Base) MCG/ACT inhaler Inhale 1-2 puffs into the lungs every 4 (four) hours as needed for shortness of breath or wheezing. 07/02/20  Yes [provider]  aspirin 81 MG tablet Take 81 mg by mouth daily.     Yes [provider]  Cholecalciferol (VITAMIN D PO) Take 1 capsule by mouth daily.    Yes [provider]  Dulaglutide 0.75 MG/0.5ML SOPN Inject 0.75 mg into the skin every Monday.   Yes [provider]  empagliflozin (JARDIANCE) 25 MG TABS tablet Take 25 mg by mouth daily.   Yes [provider]  metFORMIN (GLUCOPHAGE-XR) 500 MG 24 hr tablet Take 1,000 mg by mouth 2 (two) times daily.   Yes [provider]  pioglitazone (  ACTOS) 30 MG tablet Take 1 tablet by mouth daily. 07/25/19  Yes [provider]  Polyethyl Glycol-Propyl Glycol 0.4-0.3 % SOLN Place 1 drop into the left eye as needed.   Yes [provider]  ramipril (ALTACE) 10 MG tablet Take 1 tablet (10 mg total) by mouth 2 (two) times daily. 03/25/11  Yes Nahser, Wonda Cheng, MD  rosuvastatin (CRESTOR) 40 MG tablet Take 40 mg by mouth at bedtime. 06/04/17  Yes [provider]  valACYclovir (VALTREX) 1000 MG tablet Take 2,000 mg by mouth as needed (fever blister). 09/01/18  Yes [provider]  olmesartan (BENICAR) 40 MG tablet Take 40 mg by mouth daily. 10/18/20   [provider]    Physical  Exam: Constitutional: Moderately built and nourished. Vitals:   10/24/20 0045 10/24/20 0115 10/24/20 0130 10/24/20 0234  BP: 138/71 (!) 156/74 (!) 160/77 (!) 156/73  Pulse: 80 74 75 78  Resp: '15 16 18 16  '$ Temp:      TempSrc:      SpO2: 96% 96% 98% 97%  Weight:      Height:       Eyes: Anicteric no pallor. ENMT: No discharge from the ears eyes nose and mouth. Neck: No mass felt.  No neck rigidity. Respiratory: No rhonchi or crepitations. Cardiovascular: S1-S2 heard. Abdomen: Soft nontender bowel sound present. Musculoskeletal: No edema. Skin: No rash. Neurologic: Alert awake oriented time place and person.  Moving all extremities 5 x 5.  No facial asymmetry tongue is midline.  Pupils are reacting on the right eye.  Left eye has bubbles inside. Psychiatric: Appears normal.  Normal affect.   Labs on Admission: I have personally reviewed following labs and imaging studies  CBC: Recent Labs  Lab 10/23/20 1510  WBC 6.6  NEUTROABS 3.9  HGB 14.6  HCT 44.6  MCV 95.9  PLT 0000000   Basic Metabolic Panel: Recent Labs  Lab 10/23/20 1510  NA 138  K 4.3  CL 102  CO2 25  GLUCOSE 186*  BUN 22  CREATININE 1.05  CALCIUM 9.5   GFR: Estimated Creatinine Clearance: 67.1 mL/min (by C-G formula based on SCr of 1.05 mg/dL). Liver Function Tests: Recent Labs  Lab 10/23/20 1510  AST 17  ALT 18  ALKPHOS 33*  BILITOT 0.6  PROT 6.6  ALBUMIN 3.9   No results for input(s): LIPASE, AMYLASE in the last 168 hours. No results for input(s): AMMONIA in the last 168 hours. Coagulation Profile: Recent Labs  Lab 10/23/20 1510  INR 0.9   Cardiac Enzymes: No results for input(s): CKTOTAL, CKMB, CKMBINDEX, TROPONINI in the last 168 hours. BNP (last 3 results) No results for input(s): PROBNP in the last 8760 hours. HbA1C: No results for input(s): HGBA1C in the last 72 hours. CBG: No results for input(s): GLUCAP in the last 168 hours. Lipid Profile: No results for input(s): CHOL, HDL,  LDLCALC, TRIG, CHOLHDL, LDLDIRECT in the last 72 hours. Thyroid Function Tests: No results for input(s): TSH, T4TOTAL, FREET4, T3FREE, THYROIDAB in the last 72 hours. Anemia Panel: No results for input(s): VITAMINB12, FOLATE, FERRITIN, TIBC, IRON, RETICCTPCT in the last 72 hours. Urine analysis: No results found for: COLORURINE, APPEARANCEUR, LABSPEC, PHURINE, GLUCOSEU, HGBUR, BILIRUBINUR, KETONESUR, PROTEINUR, UROBILINOGEN, NITRITE, LEUKOCYTESUR Sepsis Labs: '@LABRCNTIP'$ (procalcitonin:4,lacticidven:4) )No results found for this or any previous visit (from the past 240 hour(s)).   Radiological Exams on Admission: CT Angio Head W/Cm &/Or Wo Cm  Result Date: 10/24/2020 CLINICAL DATA:  Initial evaluation for neuro deficit, stroke suspected.  EXAM: CT ANGIOGRAPHY HEAD AND NECK TECHNIQUE: Multidetector CT imaging of the head and neck was performed using the standard protocol during bolus administration of intravenous contrast. Multiplanar CT image reconstructions and MIPs were obtained to evaluate the vascular anatomy. Carotid stenosis measurements (when applicable) are obtained utilizing NASCET criteria, using the distal internal carotid diameter as the denominator. CONTRAST:  27m OMNIPAQUE IOHEXOL 350 MG/ML SOLN COMPARISON:  CT from 10/23/2020. FINDINGS: CTA NECK FINDINGS Aortic arch: Visualized aortic arch normal caliber with normal branch pattern. Mild atheromatous change about the arch and origin of the great vessels without significant stenosis. Right carotid system: Right CCA patent from its origin to the bifurcation without stenosis. Mixed eccentric plaque about the right carotid bulb without significant stenosis. Multifocal irregularity about the mid-distal cervical right ICA could reflect atherosclerotic change versus mild changes of FMD. No significant stenosis. Left carotid system: Left CCA patent from its origin to the bifurcation without stenosis. Scattered eccentric plaque about the left  carotid bulb/proximal left ICA with no more than mild 30% stenosis by NASCET criteria. Multifocal irregularity involving the mid-distal cervical left ICA could be atherosclerotic in nature or reflect changes of mild FMD. No other significant stenosis. Vertebral arteries: Both vertebral arteries arise from subclavian arteries. No proximal subclavian artery stenosis. Atheromatous change at the origins of both vertebral arteries with associated moderate approximate 50% stenoses. Vertebral arteries otherwise patent without stenosis, dissection or occlusion. Skeleton: No visible acute osseous finding. No discrete or worrisome osseous lesions. Moderate spondylosis present at C4-5 through C6-7. Other neck: No other acute soft tissue abnormality within the neck. No mass or adenopathy. Upper chest: Visualized upper chest demonstrates no other acute finding. Review of the MIP images confirms the above findings CTA HEAD FINDINGS Anterior circulation: Petrous, cavernous, and supraclinoid segments of both internal carotid arteries widely patent without stenosis. A1 segments patent bilaterally. Normal anterior communicating artery complex. Anterior cerebral arteries patent to their distal aspects without stenosis. No M1 stenosis or occlusion. Normal MCA bifurcations. Distal MCA branches well perfused and symmetric. Posterior circulation: Both vertebral arteries patent to the vertebrobasilar junction without stenosis. Left vertebral artery dominant. Both PICA origins patent and within normal limits. Basilar patent to its distal aspect without stenosis. Superior cerebellar arteries patent bilaterally. Both PCAs primarily supplied via the basilar well perfused to their distal aspects. Venous sinuses: Grossly patent allowing for timing the contrast bolus. Anatomic variants: None significant.  No aneurysm. Review of the MIP images confirms the above findings IMPRESSION: 1. Negative CTA for large vessel occlusion. 2. Atheromatous  change at the origins of both vertebral arteries with associated moderate approximate 50% stenoses. 3. Additional mild atheromatous change about the aortic arch and carotid bifurcations without hemodynamically significant stenosis. 4. Subtle multifocal irregularity about the mid-distal cervical ICAs bilaterally, which could reflect atherosclerotic change versus mild changes of FMD. Electronically Signed   By: BJeannine BogaM.D.   On: 10/24/2020 02:58   CT HEAD WO CONTRAST  Result Date: 10/23/2020 CLINICAL DATA:  Neuro deficit, acute, stroke suspected. Vision changes in the right eye EXAM: CT HEAD WITHOUT CONTRAST TECHNIQUE: Contiguous axial images were obtained from the base of the skull through the vertex without intravenous contrast. COMPARISON:  None. FINDINGS: Brain: No evidence of acute infarction, hemorrhage, hydrocephalus, extra-axial collection or mass lesion/mass effect. Nonspecific basal ganglia calcifications. Vascular: Atherosclerotic calcifications involving the large vessels of the skull base. No unexpected hyperdense vessel. Skull: Normal. Negative for fracture or focal lesion. Sinuses/Orbits: Postoperative changes to the left lobe. Right  orbital structures appear unremarkable by CT. Paranasal sinuses and mastoid air cells are clear. Other: None. IMPRESSION: No acute intracranial findings. Electronically Signed   By: Davina Poke D.O.   On: 10/23/2020 16:54   CT Angio Neck W and/or Wo Contrast  Result Date: 10/24/2020 CLINICAL DATA:  Initial evaluation for neuro deficit, stroke suspected. EXAM: CT ANGIOGRAPHY HEAD AND NECK TECHNIQUE: Multidetector CT imaging of the head and neck was performed using the standard protocol during bolus administration of intravenous contrast. Multiplanar CT image reconstructions and MIPs were obtained to evaluate the vascular anatomy. Carotid stenosis measurements (when applicable) are obtained utilizing NASCET criteria, using the distal internal  carotid diameter as the denominator. CONTRAST:  22m OMNIPAQUE IOHEXOL 350 MG/ML SOLN COMPARISON:  CT from 10/23/2020. FINDINGS: CTA NECK FINDINGS Aortic arch: Visualized aortic arch normal caliber with normal branch pattern. Mild atheromatous change about the arch and origin of the great vessels without significant stenosis. Right carotid system: Right CCA patent from its origin to the bifurcation without stenosis. Mixed eccentric plaque about the right carotid bulb without significant stenosis. Multifocal irregularity about the mid-distal cervical right ICA could reflect atherosclerotic change versus mild changes of FMD. No significant stenosis. Left carotid system: Left CCA patent from its origin to the bifurcation without stenosis. Scattered eccentric plaque about the left carotid bulb/proximal left ICA with no more than mild 30% stenosis by NASCET criteria. Multifocal irregularity involving the mid-distal cervical left ICA could be atherosclerotic in nature or reflect changes of mild FMD. No other significant stenosis. Vertebral arteries: Both vertebral arteries arise from subclavian arteries. No proximal subclavian artery stenosis. Atheromatous change at the origins of both vertebral arteries with associated moderate approximate 50% stenoses. Vertebral arteries otherwise patent without stenosis, dissection or occlusion. Skeleton: No visible acute osseous finding. No discrete or worrisome osseous lesions. Moderate spondylosis present at C4-5 through C6-7. Other neck: No other acute soft tissue abnormality within the neck. No mass or adenopathy. Upper chest: Visualized upper chest demonstrates no other acute finding. Review of the MIP images confirms the above findings CTA HEAD FINDINGS Anterior circulation: Petrous, cavernous, and supraclinoid segments of both internal carotid arteries widely patent without stenosis. A1 segments patent bilaterally. Normal anterior communicating artery complex. Anterior cerebral  arteries patent to their distal aspects without stenosis. No M1 stenosis or occlusion. Normal MCA bifurcations. Distal MCA branches well perfused and symmetric. Posterior circulation: Both vertebral arteries patent to the vertebrobasilar junction without stenosis. Left vertebral artery dominant. Both PICA origins patent and within normal limits. Basilar patent to its distal aspect without stenosis. Superior cerebellar arteries patent bilaterally. Both PCAs primarily supplied via the basilar well perfused to their distal aspects. Venous sinuses: Grossly patent allowing for timing the contrast bolus. Anatomic variants: None significant.  No aneurysm. Review of the MIP images confirms the above findings IMPRESSION: 1. Negative CTA for large vessel occlusion. 2. Atheromatous change at the origins of both vertebral arteries with associated moderate approximate 50% stenoses. 3. Additional mild atheromatous change about the aortic arch and carotid bifurcations without hemodynamically significant stenosis. 4. Subtle multifocal irregularity about the mid-distal cervical ICAs bilaterally, which could reflect atherosclerotic change versus mild changes of FMD. Electronically Signed   By: BJeannine BogaM.D.   On: 10/24/2020 02:58    EKG: Independently reviewed.  Normal sinus rhythm.  Assessment/Plan Principal Problem:   Vision loss of right eye Active Problems:   Hypertension   Hyperlipidemia   Diabetes mellitus type 2 in nonobese ([facility]    Vision  loss in the right eye mostly in the lower half of the visual field like a bandlike lesion concerning for central retinal vein occlusion for which patient was given Avastin injection last week.  At this time admitted for ruling out stroke and stroke work-up.  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  Patient passed stroke swallow.  We will keep patient on neurochecks.  Patient is already on aspirin.  Check hemoglobin A1c lipid panel 2D  echo. Hypertension patient's ACE inhibitor was changed to ARB last week..  Which patient is here to start. Diabetes mellitus type 2 we will keep patient on sliding scale coverage while in the hospital.  Hemoglobin A1c is pending.   DVT prophylaxis: Lovenox. Code Status: Full code. Family Communication: Patient's wife at the bedside. Disposition Plan: Home. Consults called: Neurology. Admission status: Observation.   Rise Patience MD [facility] Pager [phone].  If 7PM-7AM, please contact night-coverage [email or web address] Password [facility]  10/24/2020, 4:53 AM

## 2020-10-24 NOTE — Progress Notes (Signed)
  Echocardiogram 2D Echocardiogram has been performed.  Anthony Brennan 10/24/2020, 1:45 PM

## 2020-10-24 NOTE — Consult Note (Signed)
NEURO HOSPITALIST CONSULT NOTE   Requesting physician: Dr. Ralene Bathe  Reason for Consult: One week history of monocular vision loss  History obtained from:   Patient and Chart     HPI:                                                                                                                                          Anthony Brennan is an 70 y.o. male with a longstanding history of ophthalmic disease starting in his 20's including left retinal detachment, pseudophakia of both eyes, vitreous hemorrhage of right eye and hypertensive retinopathy OU, as well as diagnoses of HTN, DM and HLD, who presented on Monday for stroke evaluation to further assess etiology of new onset right vision loss, per the advice of the Ophthalmologist he saw at [facility] on the same day.   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 He has had prior retinal bleeding on the right. His eye problems have been attributed to his HTN and DM, but both are relatively well controlled per patient. Unusual aspect is the early age of onset of his first eye issues in his 70's, first manifesting in his left eye; he states that a retinal evaluation then revealed a "thin retina". His brother has retinal issues that are longstanding, but neither parent nor the patient's children have significant eye problems. None of his brother's daughters have significant eye disease either.   Current right eye symptoms began about 4 days prior to last Tuesday, with waxing and waning patch of obscured and spotty vision in his right eye while visiting the beach. Over the next several days, the vision loss seemed to become constant, with a patch of lost vision in both inferior quadrants of his right eye resembling a "whale shape". He was assessed by his Ophthalmologist Dr. 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 The patient was administered an Avastin  injection at that time. He states that vision has improved from 20/30 to 20/25 since then. Denies headache.   The patient's assessment by Dr. Domenick Gong of Ophthalmology at [facility] on Monday for a second opinion was reviewed from the paper note. Exceprts as follows: "I am sending this patient to you for a full stroke workup. By history he has suffered a CRVO and underwent an Avastin injection last week, but I am concerned that the event may have been either a CRAO or a combined event , given his exam and OCT findings. Given that symptoms started over a week ago, local immediate FU for a stroke evaluation is appropriate." Assesment was as follows: "No new breaks or taears on depressed exam OD tofay, there is a well  treated area of retina from which there is some assoc VH inferiorly (mild, chronic). New CRAO (vs CRVO per local MD) symptoms beginning over a week ago. Had IVA last week Dr. Zigmund Daniel w diagnosis CRVO. Today's exam shows no CME, scattered midperipheral and peripheral IRH, and generalized vascular attenuation. 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    Past Medical History:  Diagnosis Date   Chest pain    Recent episode of - stress Myoview study was normal   Diabetes mellitus    Hyperlipidemia    Hypertension    Hypertensive retinopathy    OU    Past Surgical History:  Procedure Laterality Date   APPENDECTOMY  1970   CARDIOVASCULAR STRESS TEST  02-21-2010   EF 65%   CATARACT EXTRACTION Bilateral    EYE SURGERY     HERNIA REPAIR  70 years old   RETINAL DETACHMENT SURGERY Left    S/P SB, PPV    Family History  Problem Relation Age of Onset   Hypertension Mother    Diabetes Father    Hyperlipidemia Brother    Diabetes Paternal Uncle    Diabetes Other             Social History:  reports that he has never smoked. He has never used smokeless tobacco. He reports current alcohol use. No history on file for drug use.  Allergies  Allergen Reactions    Other     Steroid eyedrop    MEDICATIONS:                                                                                                                      No current facility-administered medications on file prior to encounter.   Current Outpatient Medications on File Prior to Encounter  Medication Sig Dispense Refill   albuterol (VENTOLIN HFA) 108 (90 Base) MCG/ACT inhaler By Mouth Every 4 Hours PRN     aspirin 81 MG tablet Take 81 mg by mouth daily.       Cholecalciferol (VITAMIN D PO) Take 1 capsule by mouth daily.      Dulaglutide 0.75 MG/0.5ML SOPN Inject 1 Dose into the skin once a week.     empagliflozin (JARDIANCE) 25 MG TABS tablet Take 25 mg by mouth daily.     metFORMIN (GLUCOPHAGE-XR) 500 MG 24 hr tablet Take 500 mg by mouth 2 (two) times daily. 2 tablets in the morning and 1 tablet at night     pioglitazone (ACTOS) 30 MG tablet Take 1 tablet by mouth daily.     Polyethyl Glycol-Propyl Glycol 0.4-0.3 % SOLN Place 1 drop into the left eye as needed.     ramipril (ALTACE) 10 MG tablet Take 1 tablet (10 mg total) by mouth 2 (two) times daily. 180 tablet 3   rosuvastatin (CRESTOR) 40 MG tablet Take 1 tablet by mouth daily.  3   valACYclovir (VALTREX) 1000 MG tablet Take 2 tablets by mouth as needed. For fever blisters  ROS:                                                                                                                                       As per HPI. Comprehensive ROS otherwise negative.    Blood pressure (!) 169/76, pulse 78, temperature 98.7 F (37.1 C), temperature source Oral, resp. rate 19, height '5\' 7"'$  (1.702 m), weight 79.4 kg, SpO2 99 %.   General Examination:                                                                                                       Physical Exam  HEENT-  [place]/AT   Lungs- Respirations unlabored Extremities- No edema   Neurological Examination Mental Status: Awake, alert, fully oriented, thought content  appropriate.  Speech fluent without evidence of aphasia.  Able to follow all commands without difficulty. Cranial Nerves: II: Right pupil round and reactive. Difficult to assess left pupil due to corneal opacification. Visual acuity exam reveals chronic left eye blindness (total) and visual acuity OD of 20/40. There is a patch of lost vision overlapping portions of both inferior quadrants of the right eye. No peripheral quadrantanopsia on assessment of right eye.    III,IV, VI: Mild exotropia. EOMI. No nystagmus. No ptosis.  V,VII: Smile symmetric, facial temp sensation equal bilaterally VIII: hearing intact to voice IX,X: No hypophonia XI: Symmetric XII: Midline tongue extension Motor: Right : Upper extremity   5/5    Left:     Upper extremity   5/5  Lower extremity   5/5     Lower extremity   5/5 Normal tone throughout; no atrophy noted Sensory: Temp and light touch intact throughout, bilaterally. No extinction to DSS.  Deep Tendon Reflexes: 2+ and symmetric throughout Plantars: Right: downgoing   Left: downgoing Cerebellar: No ataxia with FNF bilaterally  Gait: Deferred   Lab Results: Basic Metabolic Panel: Recent Labs  Lab 10/23/20 1510  NA 138  K 4.3  CL 102  CO2 25  GLUCOSE 186*  BUN 22  CREATININE 1.05  CALCIUM 9.5    CBC: Recent Labs  Lab 10/23/20 1510  WBC 6.6  NEUTROABS 3.9  HGB 14.6  HCT 44.6  MCV 95.9  PLT 233    Cardiac Enzymes: No results for input(s): CKTOTAL, CKMB, CKMBINDEX, TROPONINI in the last 168 hours.  Lipid Panel: No results for input(s): CHOL, TRIG, HDL, CHOLHDL, VLDL, LDLCALC in the last 168 hours.  Imaging: CT HEAD WO CONTRAST  Result Date: 10/23/2020 CLINICAL DATA:  Neuro deficit, acute, stroke suspected. Vision changes in the right eye EXAM: CT HEAD WITHOUT CONTRAST TECHNIQUE: Contiguous axial images were obtained from the base of the skull through the vertex without intravenous contrast. COMPARISON:  None. FINDINGS: Brain: No  evidence of acute infarction, hemorrhage, hydrocephalus, extra-axial collection or mass lesion/mass effect. Nonspecific basal ganglia calcifications. Vascular: Atherosclerotic calcifications involving the large vessels of the skull base. No unexpected hyperdense vessel. Skull: Normal. Negative for fracture or focal lesion. Sinuses/Orbits: Postoperative changes to the left lobe. Right orbital structures appear unremarkable by CT. Paranasal sinuses and mastoid air cells are clear. Other: None. IMPRESSION: No acute intracranial findings. Electronically Signed   By: Davina Poke D.O.   On: 10/23/2020 16:54     Assessment:  70 y.o. male with a longstanding history of ophthalmic disease starting in his 20's including left retinal detachment, pseudophakia of both eyes, vitreous hemorrhage of right eye and hypertensive retinopathy OU, as well as diagnoses of HTN, DM and HLD, who presented on Monday for stroke evaluation to further assess one possible etiology of new onset right vision loss, per the advice of the Ophthalmologist he saw at [facility] on the same day. His primary Ophthalmologist saw him a week earlier, and diagnosed CRVO.  1. Exam reveals chronic left eye blindness (total) and visual acuity OD of 20/40. There is a patch of lost vision overlapping portions of both inferior quadrants of the right eye. No other neurological deficits noted.  2. CT head: No acute intracranial findings. 3. CTA of head and neck: Completed with report pending.   Recommendations: 1. 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 Will need to call Radiology for clearance based on CT findings.  2. TTE 3. Cardiac telemetry 4. BP management.  5. Continue ASA. Doubt that this is embolic given most likely CRVO based on Ophthalmology assessment last week. Therefore, would hold off on DAPT, especially given his history of retinal hemorrhage.  6. Continue Crestor.  7. HgbA1c, fasting lipid  panel 8. Carotid ultrasound if CTA shows ICA origin atherosclerotic disease.    Electronically signed: Dr. Kerney Elbe 10/24/2020, 12:31 AM

## 2020-10-24 NOTE — ED Notes (Signed)
DR at bedside. Will attempt CBG afterwards.

## 2020-10-24 NOTE — Evaluation (Signed)
Occupational Therapy Evaluation Patient Details Name: Anthony Brennan MRN: [id number] DOB: 1950/03/17 Today's Date: 10/24/2020    History of Present Illness 70 y.o. male with CRVO of R eye with loss of inferior visual field - Avastin injection. Stroke work up underway. History of hypertension diabetes mellitus and left vision loss from prior retinal detachment more than 15 years.   Clinical Impression   PTA pt lives independently with his wife, drives and manages his own medication, including giving  himself injections. Pt describes partial lower field loss of R eye but is able to read (takes extra time) and manage ADL and mobility @ independent level. Educated pt on compensatory strategies to help manage low vision, handouts and resources provided. Pt needs to refrain from driving until he is determined to have adequate visual field by his eye doctor. Educated pt/wife on warning signs/symptoms of stroke using BeFast. Pt verbalized understanding. No further OT needs.   Follow Up Recommendations  No OT follow up (following up with his eye doctor)    Equipment Recommendations  None recommended by OT    Recommendations for Other Services       Precautions / Restrictions Precautions Precautions: Other (comment) (low vision)      Mobility Bed Mobility Overal bed mobility: Independent                  Transfers Overall transfer level: Independent                    Balance Overall balance assessment: No apparent balance deficits (not formally assessed)                               Standardized Balance Assessment Standardized Balance Assessment : Dynamic Gait Index   Dynamic Gait Index Level Surface: Normal Change in Gait Speed: Normal Gait with Horizontal Head Turns: Normal Gait with Vertical Head Turns: Normal Gait and Pivot Turn: Normal Step Over Obstacle: Normal Step Around Obstacles: Normal Steps: Normal Total Score: 24     ADL either  performed or assessed with clinical judgement   ADL Overall ADL's : At baseline                                             Vision Baseline Vision/History: 1 Wears glasses;2 Legally blind (blind L eye) Ability to See in Adequate Light: 1 Impaired Patient Visual Report: Other (comment) (loss of partial inferior field; decribes a scotoma) Vision Assessment?: Yes Eye Alignment: Within Functional Limits Ocular Range of Motion: Within Functional Limits Alignment/Gaze Preference: Within Defined Limits Tracking/Visual Pursuits: Able to track stimulus in all quads without difficulty Saccades: Within functional limits Convergence: Within functional limits Visual Fields: Right visual field deficit Depth Perception:  (impaired)     Perception     Praxis      Pertinent Vitals/Pain Pain Assessment: No/denies pain     Hand Dominance Right   Extremity/Trunk Assessment Upper Extremity Assessment Upper Extremity Assessment: Overall WFL for tasks assessed   Lower Extremity Assessment Lower Extremity Assessment: Overall WFL for tasks assessed   Cervical / Trunk Assessment Cervical / Trunk Assessment: Normal   Communication Communication Communication: No difficulties   Cognition Arousal/Alertness: Awake/alert Behavior During Therapy: WFL for tasks assessed/performed Overall Cognitive Status: Within Functional Limits for tasks assessed  General Comments       Exercises     Shoulder Instructions      Home Living Family/patient expects to be discharged to:: Private residence Living Arrangements: Spouse/significant other Available Help at Discharge: Family Type of Home: House Home Access: Stairs to enter Technical brewer of Steps: 2 Entrance Stairs-Rails: None Home Layout: One level     Bathroom Shower/Tub: Walk-in shower;Tub/shower unit (post on both sides of porch)   Bathroom Toilet: Handicapped  height Bathroom Accessibility: Yes How Accessible: Accessible via walker Home Equipment: [place] - built in          Prior Functioning/Environment Level of Independence: Independent        Comments: drives; retired;does Trulicity 1x/wk - injection        OT Problem List: Impaired vision/perception      OT Treatment/Interventions:      OT Goals(Current goals can be found in the care plan section) Acute Rehab OT Goals Patient Stated Goal: to hopefully have vision improve OT Goal Formulation: All assessment and education complete, DC therapy  OT Frequency:     Barriers to D/C:            Co-evaluation              AM-PAC OT "6 Clicks" Daily Activity     Outcome Measure Help from another person eating meals?: None Help from another person taking care of personal grooming?: None Help from another person toileting, which includes using toliet, bedpan, or urinal?: None Help from another person bathing (including washing, rinsing, drying)?: None Help from another person to put on and taking off regular upper body clothing?: None Help from another person to put on and taking off regular lower body clothing?: None 6 Click Score: 24   End of Session Nurse Communication: Other (comment) (DC needs)  Activity Tolerance: Patient tolerated treatment well Patient left: in bed;with call bell/phone within reach;with family/visitor present  OT Visit Diagnosis: Low vision, both eyes (H54.2)                [id number] OT Time Calculation (min): 34 min Charges:  OT General Charges $OT Visit: 1 Visit OT Evaluation $OT Eval Low Complexity: [place], OT/L   Acute OT Clinical Specialist Acute Rehabilitation Services Pager [phone] Office [phone]   [facility] 10/24/2020, 10:05 AM

## 2020-10-24 NOTE — Progress Notes (Signed)
PT Cancellation Note  Patient Details Name: Anthony Brennan MRN: [id number] DOB: 11-Aug-1950   Cancelled Treatment:    Reason Eval/Treat Not Completed: PT screened, no needs identified, will sign off Per OT, pt with no skilled PT needs. Please re-consult if needs change.   Lou Miner, DPT  Acute Rehabilitation Services  Pager: [phone] Office: [phone]    Rudean Hitt 10/24/2020, 9:57 AM

## 2020-10-24 NOTE — Progress Notes (Signed)
STROKE TEAM PROGRESS NOTE   INTERVAL HISTORY His wife is at the bedside.  Pt reclining in bed, still has lower portion central vision decreased acuity on the right eye. 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 Rodena Piety but second opinion at [facility] concerning for CRAO. Sent to ER for stroke work up, so far no signficant finding with MRI and CTA head and neck. On ASA.   Per pt, he had recurrent right eye vitreal hemorrhage, last one in 03/2019 s/p laser surgery. Given his left eye blind and right eye recurrent vitreal hemorrhage, will continue his home ASA, will avoid plavix this time. Continue statin.   OBJECTIVE Vitals:   10/24/20 0045 10/24/20 0115 10/24/20 0130 10/24/20 0234  BP: 138/71 (!) 156/74 (!) 160/77 (!) 156/73  Pulse: 80 74 75 78  Resp: '15 16 18 16  '$ Temp:      TempSrc:      SpO2: 96% 96% 98% 97%  Weight:      Height:        CBC:  Recent Labs  Lab 10/23/20 1510  WBC 6.6  NEUTROABS 3.9  HGB 14.6  HCT 44.6  MCV 95.9  PLT 0000000    Basic Metabolic Panel:  Recent Labs  Lab 10/23/20 1510  NA 138  K 4.3  CL 102  CO2 25  GLUCOSE 186*  BUN 22  CREATININE 1.05  CALCIUM 9.5    Lipid Panel: No results found for: CHOL, TRIG, HDL, CHOLHDL, VLDL, LDLCALC HgbA1c: No results found for: HGBA1C Urine Drug Screen: No results found for: LABOPIA, COCAINSCRNUR, LABBENZ, AMPHETMU, THCU, LABBARB  Alcohol Level No results found for: ETH  IMAGING   CT Angio Head W/Cm &/Or Wo Cm  Result Date: 10/24/2020 CLINICAL DATA:  Initial evaluation for neuro deficit, stroke suspected. EXAM: CT ANGIOGRAPHY HEAD AND NECK TECHNIQUE: Multidetector CT imaging of the head and neck was performed using the standard protocol during bolus administration of intravenous contrast. Multiplanar CT image reconstructions and MIPs were obtained to evaluate the vascular anatomy. Carotid stenosis measurements (when applicable) are obtained utilizing NASCET criteria, using the distal  internal carotid diameter as the denominator. CONTRAST:  11m OMNIPAQUE IOHEXOL 350 MG/ML SOLN COMPARISON:  CT from 10/23/2020. FINDINGS: CTA NECK FINDINGS Aortic arch: Visualized aortic arch normal caliber with normal branch pattern. Mild atheromatous change about the arch and origin of the great vessels without significant stenosis. Right carotid system: Right CCA patent from its origin to the bifurcation without stenosis. Mixed eccentric plaque about the right carotid bulb without significant stenosis. Multifocal irregularity about the mid-distal cervical right ICA could reflect atherosclerotic change versus mild changes of FMD. No significant stenosis. Left carotid system: Left CCA patent from its origin to the bifurcation without stenosis. Scattered eccentric plaque about the left carotid bulb/proximal left ICA with no more than mild 30% stenosis by NASCET criteria. Multifocal irregularity involving the mid-distal cervical left ICA could be atherosclerotic in nature or reflect changes of mild FMD. No other significant stenosis. Vertebral arteries: Both vertebral arteries arise from subclavian arteries. No proximal subclavian artery stenosis. Atheromatous change at the origins of both vertebral arteries with associated moderate approximate 50% stenoses. Vertebral arteries otherwise patent without stenosis, dissection or occlusion. Skeleton: No visible acute osseous finding. No discrete or worrisome osseous lesions. Moderate spondylosis present at C4-5 through C6-7. Other neck: No other acute soft tissue abnormality within the neck. No mass or adenopathy. Upper chest: Visualized upper chest demonstrates no other acute finding. Review  of the MIP images confirms the above findings CTA HEAD FINDINGS Anterior circulation: Petrous, cavernous, and supraclinoid segments of both internal carotid arteries widely patent without stenosis. A1 segments patent bilaterally. Normal anterior communicating artery complex. Anterior  cerebral arteries patent to their distal aspects without stenosis. No M1 stenosis or occlusion. Normal MCA bifurcations. Distal MCA branches well perfused and symmetric. Posterior circulation: Both vertebral arteries patent to the vertebrobasilar junction without stenosis. Left vertebral artery dominant. Both PICA origins patent and within normal limits. Basilar patent to its distal aspect without stenosis. Superior cerebellar arteries patent bilaterally. Both PCAs primarily supplied via the basilar well perfused to their distal aspects. Venous sinuses: Grossly patent allowing for timing the contrast bolus. Anatomic variants: None significant.  No aneurysm. Review of the MIP images confirms the above findings IMPRESSION: 1. Negative CTA for large vessel occlusion. 2. Atheromatous change at the origins of both vertebral arteries with associated moderate approximate 50% stenoses. 3. Additional mild atheromatous change about the aortic arch and carotid bifurcations without hemodynamically significant stenosis. 4. Subtle multifocal irregularity about the mid-distal cervical ICAs bilaterally, which could reflect atherosclerotic change versus mild changes of FMD. Electronically Signed   By: Jeannine Boga M.D.   On: 10/24/2020 02:58   CT HEAD WO CONTRAST  Result Date: 10/23/2020 CLINICAL DATA:  Neuro deficit, acute, stroke suspected. Vision changes in the right eye EXAM: CT HEAD WITHOUT CONTRAST TECHNIQUE: Contiguous axial images were obtained from the base of the skull through the vertex without intravenous contrast. COMPARISON:  None. FINDINGS: Brain: No evidence of acute infarction, hemorrhage, hydrocephalus, extra-axial collection or mass lesion/mass effect. Nonspecific basal ganglia calcifications. Vascular: Atherosclerotic calcifications involving the large vessels of the skull base. No unexpected hyperdense vessel. Skull: Normal. Negative for fracture or focal lesion. Sinuses/Orbits: Postoperative changes  to the left lobe. Right orbital structures appear unremarkable by CT. Paranasal sinuses and mastoid air cells are clear. Other: None. IMPRESSION: No acute intracranial findings. Electronically Signed   By: Davina Poke D.O.   On: 10/23/2020 16:54   CT Angio Neck W and/or Wo Contrast  Result Date: 10/24/2020 CLINICAL DATA:  Initial evaluation for neuro deficit, stroke suspected. EXAM: CT ANGIOGRAPHY HEAD AND NECK TECHNIQUE: Multidetector CT imaging of the head and neck was performed using the standard protocol during bolus administration of intravenous contrast. Multiplanar CT image reconstructions and MIPs were obtained to evaluate the vascular anatomy. Carotid stenosis measurements (when applicable) are obtained utilizing NASCET criteria, using the distal internal carotid diameter as the denominator. CONTRAST:  28m OMNIPAQUE IOHEXOL 350 MG/ML SOLN COMPARISON:  CT from 10/23/2020. FINDINGS: CTA NECK FINDINGS Aortic arch: Visualized aortic arch normal caliber with normal branch pattern. Mild atheromatous change about the arch and origin of the great vessels without significant stenosis. Right carotid system: Right CCA patent from its origin to the bifurcation without stenosis. Mixed eccentric plaque about the right carotid bulb without significant stenosis. Multifocal irregularity about the mid-distal cervical right ICA could reflect atherosclerotic change versus mild changes of FMD. No significant stenosis. Left carotid system: Left CCA patent from its origin to the bifurcation without stenosis. Scattered eccentric plaque about the left carotid bulb/proximal left ICA with no more than mild 30% stenosis by NASCET criteria. Multifocal irregularity involving the mid-distal cervical left ICA could be atherosclerotic in nature or reflect changes of mild FMD. No other significant stenosis. Vertebral arteries: Both vertebral arteries arise from subclavian arteries. No proximal subclavian artery stenosis.  Atheromatous change at the origins of both vertebral arteries with  associated moderate approximate 50% stenoses. Vertebral arteries otherwise patent without stenosis, dissection or occlusion. Skeleton: No visible acute osseous finding. No discrete or worrisome osseous lesions. Moderate spondylosis present at C4-5 through C6-7. Other neck: No other acute soft tissue abnormality within the neck. No mass or adenopathy. Upper chest: Visualized upper chest demonstrates no other acute finding. Review of the MIP images confirms the above findings CTA HEAD FINDINGS Anterior circulation: Petrous, cavernous, and supraclinoid segments of both internal carotid arteries widely patent without stenosis. A1 segments patent bilaterally. Normal anterior communicating artery complex. Anterior cerebral arteries patent to their distal aspects without stenosis. No M1 stenosis or occlusion. Normal MCA bifurcations. Distal MCA branches well perfused and symmetric. Posterior circulation: Both vertebral arteries patent to the vertebrobasilar junction without stenosis. Left vertebral artery dominant. Both PICA origins patent and within normal limits. Basilar patent to its distal aspect without stenosis. Superior cerebellar arteries patent bilaterally. Both PCAs primarily supplied via the basilar well perfused to their distal aspects. Venous sinuses: Grossly patent allowing for timing the contrast bolus. Anatomic variants: None significant.  No aneurysm. Review of the MIP images confirms the above findings IMPRESSION: 1. Negative CTA for large vessel occlusion. 2. Atheromatous change at the origins of both vertebral arteries with associated moderate approximate 50% stenoses. 3. Additional mild atheromatous change about the aortic arch and carotid bifurcations without hemodynamically significant stenosis. 4. Subtle multifocal irregularity about the mid-distal cervical ICAs bilaterally, which could reflect atherosclerotic change versus mild  changes of FMD. Electronically Signed   By: Jeannine Boga M.D.   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 New right side vision changes in the lower half of the visual field, questionable diagnosis of with central retinal vein occlusion versus infarct. EXAM: MRI HEAD WITHOUT CONTRAST TECHNIQUE: Multiplanar, multiecho pulse sequences of the brain and surrounding structures were obtained without intravenous contrast. COMPARISON:  CT head yesterday, and CTA head and neck 0153 hours today. FINDINGS: Brain: No restricted diffusion to suggest acute infarction. No midline shift, mass effect, evidence of mass lesion, ventriculomegaly, extra-axial collection or acute intracranial hemorrhage. Cervicomedullary junction and pituitary are within normal limits. Very mild for age nonspecific cerebral white matter scattered T2 and FLAIR hyperintensity, mostly in the periatrial regions. No cortical encephalomalacia. No chronic cerebral blood products identified. Deep gray nuclei, brainstem and cerebellum are within normal limits. Vascular: Major intracranial vascular flow voids are preserved. Skull and upper cervical spine: Negative for age visible cervical spine. Visualized bone marrow signal is within normal limits. Sinuses/Orbits: Normal suprasellar cistern. Optic chiasm appears unremarkable. Noncontrast cavernous sinus is unremarkable. 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 There is also a small chronic left orbital floor fracture. Prior cataract surgery on the right. Right orbits soft tissues otherwise unremarkable. Paranasal Visualized paranasal sinuses and mastoids are stable and well aerated. Other: Visible internal auditory structures appear normal. Negative visible scalp and face. IMPRESSION: 1. No acute intracranial abnormality with normal for age non-contrast MRI  appearance of the brain. 2. Negative routine MRI appearance of the right orbit. Left-side postoperative changes and phthisis bulbi. Electronically Signed   By: Genevie Ann M.D.   On: 10/24/2020 06:08    ECG - SR rate 85 BPM. (See cardiology reading for complete details)  PHYSICAL EXAM  Temp:  [98 F (36.7 C)-98.7 F (37.1 C)] 98 F (36.7 C) (08/23 0729) Pulse Rate:  [  70-87] 79 (08/23 1300) Resp:  [13-20] 20 (08/23 1300) BP: (135-169)/(66-89) 160/89 (08/23 1300) SpO2:  [96 %-100 %] 100 % (08/23 1300) Weight:  [79.4 kg] 79.4 kg (08/22 2335)  General - Well nourished, well developed, in no apparent distress.  Ophthalmologic - fundi not visualized due to noncooperation.  Cardiovascular - Regular rhythm and rate.  Mental Status -  Level of arousal and orientation to time, place, and person were intact. Language including expression, naming, repetition, comprehension was assessed and found intact. Attention span and concentration were normal. Fund of Knowledge was assessed and was intact.  Cranial Nerves II - XII - II - left eye total blind. Right eye visual field intact, lower central vision decreased acuity III, IV, VI - Extraocular movements intact. V - Facial sensation intact bilaterally. VII - Facial movement intact bilaterally. VIII - Hearing & vestibular intact bilaterally. X - Palate elevates symmetrically. XI - Chin turning & shoulder shrug intact bilaterally. XII - Tongue protrusion intact.  Motor Strength - The patient's strength was normal in all extremities and pronator drift was absent.  Bulk was normal and fasciculations were absent.   Motor Tone - Muscle tone was assessed at the neck and appendages and was normal.  Reflexes - The patient's reflexes were symmetrical in all extremities and he had no pathological reflexes.  Sensory - Light touch, temperature/pinprick were assessed and were symmetrical.    Coordination - The patient had normal movements in the hands and  feet with no ataxia or dysmetria.  Tremor was absent.  Gait and Station - deferred.    ASSESSMENT/PLAN Mr. Abdulelah Vivenzio is a 70 y.o. male with history of ophthalmic disease starting in his 20's including left retinal detachment, pseudophakia of both eyes, vitreous hemorrhage of right eye and hypertensive retinopathy OU, as well as diagnoses of HTN, DM and HLD, who presented on 8/22 for stroke evaluation to further assess etiology of new onset right vision loss on the advice of ophthalmology. The patient was administered an Avastin injection last week with some visual improvement. He did not receive IV t-PA.   Right CRAO vs. CRVO - MRI negative for stroke. CT head - No acute intracranial findings. MRI head - No acute intracranial abnormality  CTA H&N - Negative CTA for large vessel occlusion. Atheromatous change at the origins of both VAs with approximate 50% stenoses. Additional mild atheromatous change about the aortic arch and carotid bifurcations without hemodynamically significant stenosis.  2D Echo - pending Lacey Jensen Virus 2 - negative LDL - 89 HgbA1c - 7.4 VTE prophylaxis - Lovenox Diet  aspirin 81 mg daily prior to admission, now on aspirin 81 mg daily. Continue on discharge. Avoid plavix now due to recurrent right vitreal hemorrhage in the setting of left eye total blindness.  Patient will be counseled to be compliant with his antithrombotic medications Ongoing aggressive stroke risk factor management Therapy recommendations: none Disposition:  home today  Hypertension Home BP meds: Altace 10 mg daily Current BP meds: irbesartan  Stable Long-term BP goal normotensive  Hyperlipidemia Home Lipid lowering medication: Crestor 40 mg daily LDL - 89, goal < 70 Resumed Crestor 40 mg daily Continue statin at discharge  Diabetes Home diabetic meds: Jardiance, Glucotrol, Actos uncontrolled HgbA1c - 7.4, goal < 7.0 SSI Close PCP follow up for better DM control  Other Stroke  Risk Factors Advanced age ETOH use, advised to drink no more than 1 alcoholic beverage per day.  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 Please call with questions. Pt will follow up with stroke clinic NP at [facility] in about 4 weeks. Thanks for the consult.  Rosalin Hawking, MD PhD Stroke Neurology 10/24/2020 2:14 PM   To contact Stroke Continuity provider, please refer to [email or web address] After hours, contact General Neurology

## 2020-10-24 NOTE — Discharge Summary (Signed)
PATIENT DETAILS Name: Anthony Brennan Age: 70 y.o. Sex: male Date of Birth: October 02, 1950 MRN: [id number]. Admitting Physician: Rise Patience, MD [id number], Fransico Him, MD  Admit Date: 10/23/2020 Discharge date: 10/24/2020  Recommendations for Outpatient Follow-up:  Follow up with PCP in 1-2 weeks Please obtain CMP/CBC in one week Please ensure follow-up with ophthalmology and outpatient neurology.  Admitted From:  Home  Disposition: [place]: No  Equipment/Devices: None  Discharge Condition: Stable  CODE STATUS: FULL CODE  Diet recommendation:  Diet Order             Diet heart healthy/carb modified Room service appropriate? 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  He was admitted for a stroke work-up.  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  MRI of the brain was negative for stroke, CTA head and neck was negative for large vessel occlusion, LDL was 89, A1c was 7.4.  Echo did not show any obvious embolic source.  Stroke MD recommending aspirin on discharge.  Patient is to follow-up with outpatient neurology and ophthalmology.  HTN: Continue usual antihypertensives-follow with PCP for further optimization.  HLD: Resume Crestor 40 mg on  discharge.  DM-2: Continue usual medications and follow-up with PCP for further optimization.  Procedures None  Discharge Diagnoses:  Principal Problem:   Vision loss of right eye Active Problems:   Hypertension   Hyperlipidemia   Diabetes mellitus type 2 in nonobese [facility])   Discharge Instructions:  Activity:  As tolerated  Discharge Instructions     Ambulatory referral to Neurology   Complete by: As directed    Follow up with stroke clinic NP (Jessica Vanschaick or Cecille Rubin, if both not available, consider Zachery Dauer, or Ahern) at [facility] in about 4 weeks. Thanks.   Diet - low sodium heart healthy   Complete by: As directed    Diet Carb Modified   Complete by: As directed    Discharge instructions   Complete by: As directed    Follow with Primary MD  Tisovec, Fransico Him, MD in 1-2 weeks  Follow-up with stroke clinic-they will give you a call-if you do not hear from them please give them a call.  Follow-up with the ophthalmologist as previously scheduled.  Please get a complete blood count and chemistry panel checked by your Primary MD at your next visit, and again as instructed by your Primary MD.  Get Medicines reviewed and adjusted: Please take all your medications with you for your next visit with your Primary MD  Laboratory/radiological data: Please request your Primary MD to go over all hospital tests and procedure/radiological results at the follow up, please ask your  Primary MD to get all Hospital records sent to his/her office.  In some cases, they will be blood work, cultures and biopsy results pending at the time of your discharge. Please request that your primary care M.D. follows up on these results.  Also Note the following: If you experience worsening of your admission symptoms, develop shortness of breath, life threatening emergency, suicidal or homicidal thoughts you must seek medical attention immediately by calling 911 or calling your MD  immediately  if symptoms less severe.  You must read complete instructions/literature along with all the possible adverse reactions/side effects for all the Medicines you take and that have been prescribed to you. Take any new Medicines after you have completely understood and accpet all the possible adverse reactions/side effects.   Do not drive when taking Pain medications or sleeping medications (Benzodaizepines)  Do not take more than prescribed Pain, Sleep and Anxiety Medications. It is not advisable to combine anxiety,sleep and pain medications without talking with your primary care practitioner  Special Instructions: If you have smoked or chewed Tobacco  in the last 2 yrs please stop smoking, stop any regular Alcohol  and or any Recreational drug use.  Wear Seat belts while driving.  Please note: You were cared for by a hospitalist during your hospital stay. Once you are discharged, your primary care physician will handle any further medical issues. Please note that NO REFILLS for any discharge medications will be authorized once you are discharged, as it is imperative that you return to your primary care physician (or establish a relationship with a primary care physician if you do not have one) for your post hospital discharge needs so that they can reassess your need for medications and monitor your lab values.   Increase activity slowly   Complete by: As directed       Allergies as of 10/24/2020       Reactions   Other    Steroid eyedrop        Medication List     STOP taking these medications    ramipril 10 MG tablet Commonly known as: ALTACE       TAKE these medications    acetaminophen 325 MG tablet Commonly known as: TYLENOL Take 650 mg by mouth every 6 (six) hours as needed for moderate pain.   albuterol 108 (90 Base) MCG/ACT inhaler Commonly known as: VENTOLIN HFA Inhale 1-2 puffs into the lungs every 4 (four) hours as needed for shortness of breath or  wheezing.   aspirin 81 MG tablet Take 81 mg by mouth daily.   Dulaglutide 0.75 MG/0.5ML Sopn Inject 0.75 mg into the skin every Monday.   empagliflozin 25 MG Tabs tablet Commonly known as: JARDIANCE Take 25 mg by mouth daily.   metFORMIN 500 MG 24 hr tablet Commonly known as: GLUCOPHAGE-XR Take 2 tablets (1,000 mg total) by mouth 2 (two) times daily. Start taking on: October 26, 2020 What changed: These instructions start on October 26, 2020. If you are unsure what to do until then, ask your doctor or other care provider.   olmesartan 40 MG tablet Commonly known as: BENICAR Take 40 mg by mouth daily.   pioglitazone 30 MG tablet Commonly known as: ACTOS Take 1 tablet by mouth daily.   Polyethyl Glycol-Propyl Glycol 0.4-0.3 % Soln Place 1 drop into the left eye as needed.   rosuvastatin 40 MG tablet Commonly known as: CRESTOR Take 40 mg by mouth at bedtime.   valACYclovir 1000 MG tablet  Commonly known as: VALTREX Take 2,000 mg by mouth as needed (fever blister).   VITAMIN D PO Take 1 capsule by mouth daily.        Follow-up Information     [facility]. Schedule an appointment as soon as possible for a visit in 1 month(s).   Specialty: Neurology Why: stroke clinic, Hospital follow up, Office will call with date/time, If you dont hear from them,please give them a call Contact information: [address] [place]        Tisovec, Fransico Him, MD. Schedule an appointment as soon as possible for a visit in 1 week(s).   Specialty: Internal Medicine Contact information: [address] [place] [address] [phone]         Nahser, Wonda Cheng, MD Follow up in 1 month(s).   Specialty: Cardiology Contact information: [address] [place] [address] [phone]                Allergies  Allergen Reactions   Other     Steroid eyedrop      Consultations:   Neuro   Other Procedures/Studies: CT Angio Head W/Cm &/Or Wo Cm  Result Date: 10/24/2020 CLINICAL DATA:  Initial evaluation for neuro deficit, stroke suspected. EXAM: CT ANGIOGRAPHY HEAD AND NECK TECHNIQUE: Multidetector CT imaging of the head and neck was performed using the standard protocol during bolus administration of intravenous contrast. Multiplanar CT image reconstructions and MIPs were obtained to evaluate the vascular anatomy. Carotid stenosis measurements (when applicable) are obtained utilizing NASCET criteria, using the distal internal carotid diameter as the denominator. CONTRAST:  55m OMNIPAQUE IOHEXOL 350 MG/ML SOLN COMPARISON:  CT from 10/23/2020. FINDINGS: CTA NECK FINDINGS Aortic arch: Visualized aortic arch normal caliber with normal branch pattern. Mild atheromatous change about the arch and origin of the great vessels without significant stenosis. Right carotid system: Right CCA patent from its origin to the bifurcation without stenosis. Mixed eccentric plaque about the right carotid bulb without significant stenosis. Multifocal irregularity about the mid-distal cervical right ICA could reflect atherosclerotic change versus mild changes of FMD. No significant stenosis. Left carotid system: Left CCA patent from its origin to the bifurcation without stenosis. Scattered eccentric plaque about the left carotid bulb/proximal left ICA with no more than mild 30% stenosis by NASCET criteria. Multifocal irregularity involving the mid-distal cervical left ICA could be atherosclerotic in nature or reflect changes of mild FMD. No other significant stenosis. Vertebral arteries: Both vertebral arteries arise from subclavian arteries. No proximal subclavian artery stenosis. Atheromatous change at the origins of both vertebral arteries with associated moderate approximate 50% stenoses. Vertebral arteries otherwise patent without stenosis, dissection or occlusion. Skeleton: No visible acute osseous  finding. No discrete or worrisome osseous lesions. Moderate spondylosis present at C4-5 through C6-7. Other neck: No other acute soft tissue abnormality within the neck. No mass or adenopathy. Upper chest: Visualized upper chest demonstrates no other acute finding. Review of the MIP images confirms the above findings CTA HEAD FINDINGS Anterior circulation: Petrous, cavernous, and supraclinoid segments of both internal carotid arteries widely patent without stenosis. A1 segments patent bilaterally. Normal anterior communicating artery complex. Anterior cerebral arteries patent to their distal aspects without stenosis. No M1 stenosis or occlusion. Normal MCA bifurcations. Distal MCA branches well perfused and symmetric. Posterior circulation: Both vertebral arteries patent to the vertebrobasilar junction without stenosis. Left vertebral artery dominant. Both PICA origins patent and within normal limits. Basilar patent to its  distal aspect without stenosis. Superior cerebellar arteries patent bilaterally. Both PCAs primarily supplied via the basilar well perfused to their distal aspects. Venous sinuses: Grossly patent allowing for timing the contrast bolus. Anatomic variants: None significant.  No aneurysm. Review of the MIP images confirms the above findings IMPRESSION: 1. Negative CTA for large vessel occlusion. 2. Atheromatous change at the origins of both vertebral arteries with associated moderate approximate 50% stenoses. 3. Additional mild atheromatous change about the aortic arch and carotid bifurcations without hemodynamically significant stenosis. 4. Subtle multifocal irregularity about the mid-distal cervical ICAs bilaterally, which could reflect atherosclerotic change versus mild changes of FMD. Electronically Signed   By: Jeannine Boga M.D.   On: 10/24/2020 02:58   CT HEAD WO CONTRAST  Result Date: 10/23/2020 CLINICAL DATA:  Neuro deficit, acute, stroke suspected. Vision changes in the right eye  EXAM: CT HEAD WITHOUT CONTRAST TECHNIQUE: Contiguous axial images were obtained from the base of the skull through the vertex without intravenous contrast. COMPARISON:  None. FINDINGS: Brain: No evidence of acute infarction, hemorrhage, hydrocephalus, extra-axial collection or mass lesion/mass effect. Nonspecific basal ganglia calcifications. Vascular: Atherosclerotic calcifications involving the large vessels of the skull base. No unexpected hyperdense vessel. Skull: Normal. Negative for fracture or focal lesion. Sinuses/Orbits: Postoperative changes to the left lobe. Right orbital structures appear unremarkable by CT. Paranasal sinuses and mastoid air cells are clear. Other: None. IMPRESSION: No acute intracranial findings. Electronically Signed   By: Davina Poke D.O.   On: 10/23/2020 16:54   CT Angio Neck W and/or Wo Contrast  Result Date: 10/24/2020 CLINICAL DATA:  Initial evaluation for neuro deficit, stroke suspected. EXAM: CT ANGIOGRAPHY HEAD AND NECK TECHNIQUE: Multidetector CT imaging of the head and neck was performed using the standard protocol during bolus administration of intravenous contrast. Multiplanar CT image reconstructions and MIPs were obtained to evaluate the vascular anatomy. Carotid stenosis measurements (when applicable) are obtained utilizing NASCET criteria, using the distal internal carotid diameter as the denominator. CONTRAST:  47m OMNIPAQUE IOHEXOL 350 MG/ML SOLN COMPARISON:  CT from 10/23/2020. FINDINGS: CTA NECK FINDINGS Aortic arch: Visualized aortic arch normal caliber with normal branch pattern. Mild atheromatous change about the arch and origin of the great vessels without significant stenosis. Right carotid system: Right CCA patent from its origin to the bifurcation without stenosis. Mixed eccentric plaque about the right carotid bulb without significant stenosis. Multifocal irregularity about the mid-distal cervical right ICA could reflect atherosclerotic change  versus mild changes of FMD. No significant stenosis. Left carotid system: Left CCA patent from its origin to the bifurcation without stenosis. Scattered eccentric plaque about the left carotid bulb/proximal left ICA with no more than mild 30% stenosis by NASCET criteria. Multifocal irregularity involving the mid-distal cervical left ICA could be atherosclerotic in nature or reflect changes of mild FMD. No other significant stenosis. Vertebral arteries: Both vertebral arteries arise from subclavian arteries. No proximal subclavian artery stenosis. Atheromatous change at the origins of both vertebral arteries with associated moderate approximate 50% stenoses. Vertebral arteries otherwise patent without stenosis, dissection or occlusion. Skeleton: No visible acute osseous finding. No discrete or worrisome osseous lesions. Moderate spondylosis present at C4-5 through C6-7. Other neck: No other acute soft tissue abnormality within the neck. No mass or adenopathy. Upper chest: Visualized upper chest demonstrates no other acute finding. Review of the MIP images confirms the above findings CTA HEAD FINDINGS Anterior circulation: Petrous, cavernous, and supraclinoid segments of both internal carotid arteries widely patent without stenosis. A1 segments patent bilaterally.  Normal anterior communicating artery complex. Anterior cerebral arteries patent to their distal aspects without stenosis. No M1 stenosis or occlusion. Normal MCA bifurcations. Distal MCA branches well perfused and symmetric. Posterior circulation: Both vertebral arteries patent to the vertebrobasilar junction without stenosis. Left vertebral artery dominant. Both PICA origins patent and within normal limits. Basilar patent to its distal aspect without stenosis. Superior cerebellar arteries patent bilaterally. Both PCAs primarily supplied via the basilar well perfused to their distal aspects. Venous sinuses: Grossly patent allowing for timing the contrast  bolus. Anatomic variants: None significant.  No aneurysm. Review of the MIP images confirms the above findings IMPRESSION: 1. Negative CTA for large vessel occlusion. 2. Atheromatous change at the origins of both vertebral arteries with associated moderate approximate 50% stenoses. 3. Additional mild atheromatous change about the aortic arch and carotid bifurcations without hemodynamically significant stenosis. 4. Subtle multifocal irregularity about the mid-distal cervical ICAs bilaterally, which could reflect atherosclerotic change versus mild changes of FMD. Electronically Signed   By: Jeannine Boga M.D.   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 New right side vision changes in the lower half of the visual field, questionable diagnosis of with central retinal vein occlusion versus infarct. EXAM: MRI HEAD WITHOUT CONTRAST TECHNIQUE: Multiplanar, multiecho pulse sequences of the brain and surrounding structures were obtained without intravenous contrast. COMPARISON:  CT head yesterday, and CTA head and neck 0153 hours today. FINDINGS: Brain: No restricted diffusion to suggest acute infarction. No midline shift, mass effect, evidence of mass lesion, ventriculomegaly, extra-axial collection or acute intracranial hemorrhage. Cervicomedullary junction and pituitary are within normal limits. Very mild for age nonspecific cerebral white matter scattered T2 and FLAIR hyperintensity, mostly in the periatrial regions. No cortical encephalomalacia. No chronic cerebral blood products identified. Deep gray nuclei, brainstem and cerebellum are within normal limits. Vascular: Major intracranial vascular flow voids are preserved. Skull and upper cervical spine: Negative for age visible cervical spine. Visualized bone marrow signal is within normal limits. Sinuses/Orbits: Normal  suprasellar cistern. Optic chiasm appears unremarkable. Noncontrast cavernous sinus is unremarkable. 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 There is also a small chronic left orbital floor fracture. Prior cataract surgery on the right. Right orbits soft tissues otherwise unremarkable. Paranasal Visualized paranasal sinuses and mastoids are stable and well aerated. Other: Visible internal auditory structures appear normal. Negative visible scalp and face. IMPRESSION: 1. No acute intracranial abnormality with normal for age non-contrast MRI appearance of the brain. 2. Negative routine MRI appearance of the right orbit. Left-side postoperative changes and phthisis bulbi. Electronically Signed   By: Genevie Ann M.D.   On: 10/24/2020 06:08   ECHOCARDIOGRAM COMPLETE  Result Date: 10/24/2020    ECHOCARDIOGRAM REPORT   Patient Name:   Anthony Brennan Date of Exam: 10/24/2020 Medical Rec #:  [id number]    Height:       67.0 in Accession #:    [id number]   Weight:       175.0 lb Date of Birth:  [birth year]    BSA:          1.911 m Patient Age:    31 years     BP:           165/84 mmHg Patient Gender: M            HR:  76 bpm. Exam Location:  Inpatient Procedure: 2D Echo Indications:    stroke  History:        Patient has no prior history of Echocardiogram examinations.                 Risk Factors:Diabetes, Hypertension and Dyslipidemia.  Sonographer:    Coker Referring Phys: Aspers  1. Left ventricular ejection fraction, by estimation, is 55 to 60%. The left ventricle has normal function. The left ventricle has no regional wall motion abnormalities. Left ventricular diastolic parameters were normal.  2. Right ventricular systolic function is normal. The right ventricular size is normal. Tricuspid regurgitation signal is inadequate for assessing PA pressure.  3. The mitral valve is normal in structure. Trivial mitral valve regurgitation. No evidence of mitral  stenosis.  4. The aortic valve is grossly normal. There is mild calcification of the aortic valve. Aortic valve regurgitation is not visualized. No aortic stenosis is present.  5. The inferior vena cava is normal in size with greater than 50% respiratory variability, suggesting right atrial pressure of 3 mmHg. Conclusion(s)/Recommendation(s): No intracardiac source of embolism detected on this transthoracic study. A transesophageal echocardiogram is recommended to exclude cardiac source of embolism if clinically indicated. FINDINGS  Left Ventricle: Left ventricular ejection fraction, by estimation, is 55 to 60%. The left ventricle has normal function. The left ventricle has no regional wall motion abnormalities. The left ventricular internal cavity size was normal in size. There is  no left ventricular hypertrophy. Left ventricular diastolic parameters were normal. Right Ventricle: The right ventricular size is normal. No increase in right ventricular wall thickness. Right ventricular systolic function is normal. Tricuspid regurgitation signal is inadequate for assessing PA pressure. Left Atrium: Left atrial size was normal in size. Right Atrium: Right atrial size was normal in size. Pericardium: There is no evidence of pericardial effusion. Mitral Valve: The mitral valve is normal in structure. Trivial mitral valve regurgitation. No evidence of mitral valve stenosis. Tricuspid Valve: The tricuspid valve is normal in structure. Tricuspid valve regurgitation is trivial. No evidence of tricuspid stenosis. Aortic Valve: The aortic valve is grossly normal. There is mild calcification of the aortic valve. Aortic valve regurgitation is not visualized. No aortic stenosis is present. Pulmonic Valve: The pulmonic valve was grossly normal. Pulmonic valve regurgitation is trivial. No evidence of pulmonic stenosis. Aorta: The aortic root is normal in size and structure. Venous: The inferior vena cava is normal in size with  greater than 50% respiratory variability, suggesting right atrial pressure of 3 mmHg. IAS/Shunts: No atrial level shunt detected by color flow Doppler.  LEFT VENTRICLE PLAX 2D LVIDd:         5.30 cm  Diastology LVIDs:         3.40 cm  LV e' medial:    8.81 cm/s LV PW:         0.80 cm  LV E/e' medial:  8.4 LV IVS:        0.80 cm  LV e' lateral:   9.03 cm/s LVOT diam:     2.00 cm  LV E/e' lateral: 8.2 LV SV:         67 LV SV Index:   35 LVOT Area:     3.14 cm  RIGHT VENTRICLE             IVC RV S prime:     16.80 cm/s  IVC diam: 1.20 cm TAPSE (M-mode): 2.4 cm LEFT ATRIUM  Index       RIGHT ATRIUM           Index LA diam:        3.60 cm 1.88 cm/m  RA Area:     13.90 cm LA Vol (A2C):   53.8 ml 28.16 ml/m RA Volume:   30.50 ml  15.96 ml/m LA Vol (A4C):   46.4 ml 24.28 ml/m LA Biplane Vol: 50.7 ml 26.54 ml/m  AORTIC VALVE LVOT Vmax:   100.00 cm/s LVOT Vmean:  70.400 cm/s LVOT VTI:    0.212 m  AORTA Ao Root diam: 3.00 cm Ao Asc diam:  3.10 cm MITRAL VALVE MV Area (PHT): 4.21 cm    SHUNTS MV Decel Time: 180 msec    Systemic VTI:  0.21 m MV E velocity: 73.70 cm/s  Systemic Diam: 2.00 cm MV A velocity: 83.90 cm/s MV E/A ratio:  0.88 Cherlynn Kaiser MD Electronically signed by Cherlynn Kaiser MD Signature Date/Time: 10/24/2020/4:04:03 PM    Final      TODAY-DAY OF DISCHARGE:  Subjective:   Anthony Brennan today has no headache,no chest abdominal pain,no new weakness tingling or numbness, feels much better wants to go home today.   Objective:   Blood pressure 136/79, pulse 73, temperature 98 F (36.7 C), temperature source Oral, resp. rate 17, height '5\' 7"'$  (1.702 m), weight 79.4 kg, SpO2 97 %. No intake or output data in the 24 hours ending 10/24/20 1614 Filed Weights   10/23/20 2335  Weight: 79.4 kg    Exam: Awake Alert, Oriented *3, No new F.N deficits, Normal affect [place].AT,PERRAL Supple Neck,No JVD, No cervical lymphadenopathy appriciated.  Symmetrical Chest wall movement, Good air  movement bilaterally, CTAB RRR,No Gallops,Rubs or new Murmurs, No Parasternal Heave +ve B.Sounds, Abd Soft, Non tender, No organomegaly appriciated, No rebound -guarding or rigidity. No Cyanosis, Clubbing or edema, No new Rash or bruise   PERTINENT RADIOLOGIC STUDIES: CT Angio Head W/Cm &/Or Wo Cm  Result Date: 10/24/2020 CLINICAL DATA:  Initial evaluation for neuro deficit, stroke suspected. EXAM: CT ANGIOGRAPHY HEAD AND NECK TECHNIQUE: Multidetector CT imaging of the head and neck was performed using the standard protocol during bolus administration of intravenous contrast. Multiplanar CT image reconstructions and MIPs were obtained to evaluate the vascular anatomy. Carotid stenosis measurements (when applicable) are obtained utilizing NASCET criteria, using the distal internal carotid diameter as the denominator. CONTRAST:  73m OMNIPAQUE IOHEXOL 350 MG/ML SOLN COMPARISON:  CT from 10/23/2020. FINDINGS: CTA NECK FINDINGS Aortic arch: Visualized aortic arch normal caliber with normal branch pattern. Mild atheromatous change about the arch and origin of the great vessels without significant stenosis. Right carotid system: Right CCA patent from its origin to the bifurcation without stenosis. Mixed eccentric plaque about the right carotid bulb without significant stenosis. Multifocal irregularity about the mid-distal cervical right ICA could reflect atherosclerotic change versus mild changes of FMD. No significant stenosis. Left carotid system: Left CCA patent from its origin to the bifurcation without stenosis. Scattered eccentric plaque about the left carotid bulb/proximal left ICA with no more than mild 30% stenosis by NASCET criteria. Multifocal irregularity involving the mid-distal cervical left ICA could be atherosclerotic in nature or reflect changes of mild FMD. No other significant stenosis. Vertebral arteries: Both vertebral arteries arise from subclavian arteries. No proximal subclavian artery  stenosis. Atheromatous change at the origins of both vertebral arteries with associated moderate approximate 50% stenoses. Vertebral arteries otherwise patent without stenosis, dissection or occlusion. Skeleton: No visible acute osseous finding. No  discrete or worrisome osseous lesions. Moderate spondylosis present at C4-5 through C6-7. Other neck: No other acute soft tissue abnormality within the neck. No mass or adenopathy. Upper chest: Visualized upper chest demonstrates no other acute finding. Review of the MIP images confirms the above findings CTA HEAD FINDINGS Anterior circulation: Petrous, cavernous, and supraclinoid segments of both internal carotid arteries widely patent without stenosis. A1 segments patent bilaterally. Normal anterior communicating artery complex. Anterior cerebral arteries patent to their distal aspects without stenosis. No M1 stenosis or occlusion. Normal MCA bifurcations. Distal MCA branches well perfused and symmetric. Posterior circulation: Both vertebral arteries patent to the vertebrobasilar junction without stenosis. Left vertebral artery dominant. Both PICA origins patent and within normal limits. Basilar patent to its distal aspect without stenosis. Superior cerebellar arteries patent bilaterally. Both PCAs primarily supplied via the basilar well perfused to their distal aspects. Venous sinuses: Grossly patent allowing for timing the contrast bolus. Anatomic variants: None significant.  No aneurysm. Review of the MIP images confirms the above findings IMPRESSION: 1. Negative CTA for large vessel occlusion. 2. Atheromatous change at the origins of both vertebral arteries with associated moderate approximate 50% stenoses. 3. Additional mild atheromatous change about the aortic arch and carotid bifurcations without hemodynamically significant stenosis. 4. Subtle multifocal irregularity about the mid-distal cervical ICAs bilaterally, which could reflect atherosclerotic change versus  mild changes of FMD. Electronically Signed   By: Jeannine Boga M.D.   On: 10/24/2020 02:58   CT HEAD WO CONTRAST  Result Date: 10/23/2020 CLINICAL DATA:  Neuro deficit, acute, stroke suspected. Vision changes in the right eye EXAM: CT HEAD WITHOUT CONTRAST TECHNIQUE: Contiguous axial images were obtained from the base of the skull through the vertex without intravenous contrast. COMPARISON:  None. FINDINGS: Brain: No evidence of acute infarction, hemorrhage, hydrocephalus, extra-axial collection or mass lesion/mass effect. Nonspecific basal ganglia calcifications. Vascular: Atherosclerotic calcifications involving the large vessels of the skull base. No unexpected hyperdense vessel. Skull: Normal. Negative for fracture or focal lesion. Sinuses/Orbits: Postoperative changes to the left lobe. Right orbital structures appear unremarkable by CT. Paranasal sinuses and mastoid air cells are clear. Other: None. IMPRESSION: No acute intracranial findings. Electronically Signed   By: Davina Poke D.O.   On: 10/23/2020 16:54   CT Angio Neck W and/or Wo Contrast  Result Date: 10/24/2020 CLINICAL DATA:  Initial evaluation for neuro deficit, stroke suspected. EXAM: CT ANGIOGRAPHY HEAD AND NECK TECHNIQUE: Multidetector CT imaging of the head and neck was performed using the standard protocol during bolus administration of intravenous contrast. Multiplanar CT image reconstructions and MIPs were obtained to evaluate the vascular anatomy. Carotid stenosis measurements (when applicable) are obtained utilizing NASCET criteria, using the distal internal carotid diameter as the denominator. CONTRAST:  72m OMNIPAQUE IOHEXOL 350 MG/ML SOLN COMPARISON:  CT from 10/23/2020. FINDINGS: CTA NECK FINDINGS Aortic arch: Visualized aortic arch normal caliber with normal branch pattern. Mild atheromatous change about the arch and origin of the great vessels without significant stenosis. Right carotid system: Right CCA patent  from its origin to the bifurcation without stenosis. Mixed eccentric plaque about the right carotid bulb without significant stenosis. Multifocal irregularity about the mid-distal cervical right ICA could reflect atherosclerotic change versus mild changes of FMD. No significant stenosis. Left carotid system: Left CCA patent from its origin to the bifurcation without stenosis. Scattered eccentric plaque about the left carotid bulb/proximal left ICA with no more than mild 30% stenosis by NASCET criteria. Multifocal irregularity involving the mid-distal cervical left ICA could be  atherosclerotic in nature or reflect changes of mild FMD. No other significant stenosis. Vertebral arteries: Both vertebral arteries arise from subclavian arteries. No proximal subclavian artery stenosis. Atheromatous change at the origins of both vertebral arteries with associated moderate approximate 50% stenoses. Vertebral arteries otherwise patent without stenosis, dissection or occlusion. Skeleton: No visible acute osseous finding. No discrete or worrisome osseous lesions. Moderate spondylosis present at C4-5 through C6-7. Other neck: No other acute soft tissue abnormality within the neck. No mass or adenopathy. Upper chest: Visualized upper chest demonstrates no other acute finding. Review of the MIP images confirms the above findings CTA HEAD FINDINGS Anterior circulation: Petrous, cavernous, and supraclinoid segments of both internal carotid arteries widely patent without stenosis. A1 segments patent bilaterally. Normal anterior communicating artery complex. Anterior cerebral arteries patent to their distal aspects without stenosis. No M1 stenosis or occlusion. Normal MCA bifurcations. Distal MCA branches well perfused and symmetric. Posterior circulation: Both vertebral arteries patent to the vertebrobasilar junction without stenosis. Left vertebral artery dominant. Both PICA origins patent and within normal limits. Basilar patent to  its distal aspect without stenosis. Superior cerebellar arteries patent bilaterally. Both PCAs primarily supplied via the basilar well perfused to their distal aspects. Venous sinuses: Grossly patent allowing for timing the contrast bolus. Anatomic variants: None significant.  No aneurysm. Review of the MIP images confirms the above findings IMPRESSION: 1. Negative CTA for large vessel occlusion. 2. Atheromatous change at the origins of both vertebral arteries with associated moderate approximate 50% stenoses. 3. Additional mild atheromatous change about the aortic arch and carotid bifurcations without hemodynamically significant stenosis. 4. Subtle multifocal irregularity about the mid-distal cervical ICAs bilaterally, which could reflect atherosclerotic change versus mild changes of FMD. Electronically Signed   By: Jeannine Boga M.D.   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 New right side vision changes in the lower half of the visual field, questionable diagnosis of with central retinal vein occlusion versus infarct. EXAM: MRI HEAD WITHOUT CONTRAST TECHNIQUE: Multiplanar, multiecho pulse sequences of the brain and surrounding structures were obtained without intravenous contrast. COMPARISON:  CT head yesterday, and CTA head and neck 0153 hours today. FINDINGS: Brain: No restricted diffusion to suggest acute infarction. No midline shift, mass effect, evidence of mass lesion, ventriculomegaly, extra-axial collection or acute intracranial hemorrhage. Cervicomedullary junction and pituitary are within normal limits. Very mild for age nonspecific cerebral white matter scattered T2 and FLAIR hyperintensity, mostly in the periatrial regions. No cortical encephalomalacia. No chronic cerebral blood products identified. Deep gray nuclei, brainstem and cerebellum are within  normal limits. Vascular: Major intracranial vascular flow voids are preserved. Skull and upper cervical spine: Negative for age visible cervical spine. Visualized bone marrow signal is within normal limits. Sinuses/Orbits: Normal suprasellar cistern. Optic chiasm appears unremarkable. Noncontrast cavernous sinus is unremarkable. 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 There is also a small chronic left orbital floor fracture. Prior cataract surgery on the right. Right orbits soft tissues otherwise unremarkable. Paranasal Visualized paranasal sinuses and mastoids are stable and well aerated. Other: Visible internal auditory structures appear normal. Negative visible scalp and face. IMPRESSION: 1. No acute intracranial abnormality with normal for age non-contrast MRI appearance of the brain. 2. Negative routine MRI appearance of the right orbit. Left-side postoperative changes and phthisis bulbi. Electronically Signed   By: Genevie Ann M.D.   On: 10/24/2020  06:08   ECHOCARDIOGRAM COMPLETE  Result Date: 10/24/2020    ECHOCARDIOGRAM REPORT   Patient Name:   Anthony Brennan Date of Exam: 10/24/2020 Medical Rec #:  [id number]    Height:       67.0 in Accession #:    [id number]   Weight:       175.0 lb Date of Birth:  [birth year]    BSA:          1.911 m Patient Age:    70 years     BP:           165/84 mmHg Patient Gender: M            HR:           76 bpm. Exam Location:  Inpatient Procedure: 2D Echo Indications:    stroke  History:        Patient has no prior history of Echocardiogram examinations.                 Risk Factors:Diabetes, Hypertension and Dyslipidemia.  Sonographer:    [employer] Referring Phys: [place]  1. Left ventricular ejection fraction, by estimation, is 55 to 60%. The left ventricle has normal function. The left ventricle has no regional wall motion abnormalities. Left ventricular diastolic parameters were normal.  2. Right ventricular systolic  function is normal. The right ventricular size is normal. Tricuspid regurgitation signal is inadequate for assessing PA pressure.  3. The mitral valve is normal in structure. Trivial mitral valve regurgitation. No evidence of mitral stenosis.  4. The aortic valve is grossly normal. There is mild calcification of the aortic valve. Aortic valve regurgitation is not visualized. No aortic stenosis is present.  5. The inferior vena cava is normal in size with greater than 50% respiratory variability, suggesting right atrial pressure of 3 mmHg. Conclusion(s)/Recommendation(s): No intracardiac source of embolism detected on this transthoracic study. A transesophageal echocardiogram is recommended to exclude cardiac source of embolism if clinically indicated. FINDINGS  Left Ventricle: Left ventricular ejection fraction, by estimation, is 55 to 60%. The left ventricle has normal function. The left ventricle has no regional wall motion abnormalities. The left ventricular internal cavity size was normal in size. There is  no left ventricular hypertrophy. Left ventricular diastolic parameters were normal. Right Ventricle: The right ventricular size is normal. No increase in right ventricular wall thickness. Right ventricular systolic function is normal. Tricuspid regurgitation signal is inadequate for assessing PA pressure. Left Atrium: Left atrial size was normal in size. Right Atrium: Right atrial size was normal in size. Pericardium: There is no evidence of pericardial effusion. Mitral Valve: The mitral valve is normal in structure. Trivial mitral valve regurgitation. No evidence of mitral valve stenosis. Tricuspid Valve: The tricuspid valve is normal in structure. Tricuspid valve regurgitation is trivial. No evidence of tricuspid stenosis. Aortic Valve: The aortic valve is grossly normal. There is mild calcification of the aortic valve. Aortic valve regurgitation is not visualized. No aortic stenosis is present. Pulmonic  Valve: The pulmonic valve was grossly normal. Pulmonic valve regurgitation is trivial. No evidence of pulmonic stenosis. Aorta: The aortic root is normal in size and structure. Venous: The inferior vena cava is normal in size with greater than 50% respiratory variability, suggesting right atrial pressure of 3 mmHg. IAS/Shunts: No atrial level shunt detected by color flow Doppler.  LEFT VENTRICLE PLAX 2D LVIDd:         5.30 cm  Diastology LVIDs:  3.40 cm  LV e' medial:    8.81 cm/s LV PW:         0.80 cm  LV E/e' medial:  8.4 LV IVS:        0.80 cm  LV e' lateral:   9.03 cm/s LVOT diam:     2.00 cm  LV E/e' lateral: 8.2 LV SV:         67 LV SV Index:   35 LVOT Area:     3.14 cm  RIGHT VENTRICLE             IVC RV S prime:     16.80 cm/s  IVC diam: 1.20 cm TAPSE (M-mode): 2.4 cm LEFT ATRIUM             Index       RIGHT ATRIUM           Index LA diam:        3.60 cm 1.88 cm/m  RA Area:     13.90 cm LA Vol (A2C):   53.8 ml 28.16 ml/m RA Volume:   30.50 ml  15.96 ml/m LA Vol (A4C):   46.4 ml 24.28 ml/m LA Biplane Vol: 50.7 ml 26.54 ml/m  AORTIC VALVE LVOT Vmax:   100.00 cm/s LVOT Vmean:  70.400 cm/s LVOT VTI:    0.212 m  AORTA Ao Root diam: 3.00 cm Ao Asc diam:  3.10 cm MITRAL VALVE MV Area (PHT): 4.21 cm    SHUNTS MV Decel Time: 180 msec    Systemic VTI:  0.21 m MV E velocity: 73.70 cm/s  Systemic Diam: 2.00 cm MV A velocity: 83.90 cm/s MV E/A ratio:  0.88 Cherlynn Kaiser MD Electronically signed by Cherlynn Kaiser MD Signature Date/Time: 10/24/2020/4:04:03 PM    Final      PERTINENT LAB RESULTS: CBC: Recent Labs    10/23/20 1510 10/24/20 0730  WBC 6.6 6.6  HGB 14.6 15.0  HCT 44.6 45.7  PLT 233 233   CMET CMP     Component Value Date/Time   NA 138 10/23/2020 1510   K 4.3 10/23/2020 1510   CL 102 10/23/2020 1510   CO2 25 10/23/2020 1510   GLUCOSE 186 (H) 10/23/2020 1510   BUN 22 10/23/2020 1510   CREATININE 0.88 10/24/2020 0730   CALCIUM 9.5 10/23/2020 1510   PROT 6.6 10/23/2020  1510   ALBUMIN 3.9 10/23/2020 1510   AST 17 10/23/2020 1510   ALT 18 10/23/2020 1510   ALKPHOS 33 (L) 10/23/2020 1510   BILITOT 0.6 10/23/2020 1510   GFRNONAA >60 10/24/2020 0730    GFR Estimated Creatinine Clearance: 80 mL/min (by C-G formula based on SCr of 0.88 mg/dL). No results for input(s): LIPASE, AMYLASE in the last 72 hours. No results for input(s): CKTOTAL, CKMB, CKMBINDEX, TROPONINI in the last 72 hours. Invalid input(s): POCBNP No results for input(s): DDIMER in the last 72 hours. Recent Labs    10/24/20 0730  HGBA1C 7.4*   Recent Labs    10/24/20 0730  CHOL 180  HDL 64  LDLCALC 89  TRIG 137  CHOLHDL 2.8   No results for input(s): TSH, T4TOTAL, T3FREE, THYROIDAB in the last 72 hours.  Invalid input(s): FREET3 No results for input(s): VITAMINB12, FOLATE, FERRITIN, TIBC, IRON, RETICCTPCT in the last 72 hours. Coags: Recent Labs    10/23/20 1510  INR 0.9   Microbiology: Recent Results (from the past 240 hour(s))  SARS CORONAVIRUS 2 (TAT 6-24 HRS) Nasopharyngeal Nasopharyngeal Swab  Status: None   Collection Time: 10/24/20  1:20 AM   Specimen: Nasopharyngeal Swab  Result Value Ref Range Status   SARS Coronavirus 2 NEGATIVE NEGATIVE Final    Comment: (NOTE) SARS-CoV-2 target nucleic acids are NOT DETECTED.  The SARS-CoV-2 RNA is generally detectable in upper and lower respiratory specimens during the acute phase of infection. Negative results do not preclude SARS-CoV-2 infection, do not rule out co-infections with other pathogens, and should not be used as the sole basis for treatment or other patient management decisions. Negative results must be combined with clinical observations, patient history, and epidemiological information. The expected result is Negative.  Fact Sheet for Patients: SugarRoll.be  Fact Sheet for Healthcare Providers: https://www.woods-mathews.com/  This test is not yet approved or  cleared by the [place] FDA and  has been authorized for detection and/or diagnosis of SARS-CoV-2 by FDA under an Emergency Use Authorization (EUA). This EUA will remain  in effect (meaning this test can be used) for the duration of the COVID-19 declaration under Se ction 564(b)(1) of the Act, 21 U.S.C. section 360bbb-3(b)(1), unless the authorization is terminated or revoked sooner.  Performed at [facility], [place] [address] [place], [place] [address]     FURTHER DISCHARGE INSTRUCTIONS:  Get Medicines reviewed and adjusted: Please take all your medications with you for your next visit with your Primary MD  Laboratory/radiological data: Please request your Primary MD to go over all hospital tests and procedure/radiological results at the follow up, please ask your Primary MD to get all Hospital records sent to his/her office.  In some cases, they will be blood work, cultures and biopsy results pending at the time of your discharge. Please request that your primary care M.D. goes through all the records of your hospital data and follows up on these results.  Also Note the following: If you experience worsening of your admission symptoms, develop shortness of breath, life threatening emergency, suicidal or homicidal thoughts you must seek medical attention immediately by calling 911 or calling your MD immediately  if symptoms less severe.  You must read complete instructions/literature along with all the possible adverse reactions/side effects for all the Medicines you take and that have been prescribed to you. Take any new Medicines after you have completely understood and accpet all the possible adverse reactions/side effects.   Do not drive when taking Pain medications or sleeping medications (Benzodaizepines)  Do not take more than prescribed Pain, Sleep and Anxiety Medications. It is not advisable to combine anxiety,sleep and pain medications without talking with your  primary care practitioner  Special Instructions: If you have smoked or chewed Tobacco  in the last 2 yrs please stop smoking, stop any regular Alcohol  and or any Recreational drug use.  Wear Seat belts while driving.  Please note: You were cared for by a hospitalist during your hospital stay. Once you are discharged, your primary care physician will handle any further medical issues. Please note that NO REFILLS for any discharge medications will be authorized once you are discharged, as it is imperative that you return to your primary care physician (or establish a relationship with a primary care physician if you do not have one) for your post hospital discharge needs so that they can reassess your need for medications and monitor your lab values.  Total Time spent coordinating discharge including counseling, education and face to face time equals 35 minutes.  SignedOren Binet 10/24/2020 4:14 PM

## 2020-10-24 NOTE — ED Notes (Signed)
Echo at bedside

## 2020-10-25 DIAGNOSIS — K2100 Gastro-esophageal reflux disease with esophagitis, without bleeding: Secondary | ICD-10-CM | POA: Diagnosis not present

## 2020-10-31 DIAGNOSIS — E7800 Pure hypercholesterolemia, unspecified: Secondary | ICD-10-CM | POA: Diagnosis not present

## 2020-10-31 DIAGNOSIS — H348112 Central retinal vein occlusion, right eye, stable: Secondary | ICD-10-CM | POA: Diagnosis not present

## 2020-10-31 DIAGNOSIS — E119 Type 2 diabetes mellitus without complications: Secondary | ICD-10-CM | POA: Diagnosis not present

## 2020-10-31 DIAGNOSIS — I10 Essential (primary) hypertension: Secondary | ICD-10-CM | POA: Diagnosis not present

## 2020-11-14 ENCOUNTER — Encounter (INDEPENDENT_AMBULATORY_CARE_PROVIDER_SITE_OTHER): Payer: Medicare Other | Admitting: Ophthalmology

## 2020-11-14 ENCOUNTER — Other Ambulatory Visit: Payer: Self-pay

## 2020-11-14 DIAGNOSIS — H338 Other retinal detachments: Secondary | ICD-10-CM

## 2020-11-14 DIAGNOSIS — I10 Essential (primary) hypertension: Secondary | ICD-10-CM | POA: Diagnosis not present

## 2020-11-14 DIAGNOSIS — H43811 Vitreous degeneration, right eye: Secondary | ICD-10-CM | POA: Diagnosis not present

## 2020-11-14 DIAGNOSIS — H34811 Central retinal vein occlusion, right eye, with macular edema: Secondary | ICD-10-CM

## 2020-11-14 DIAGNOSIS — H35031 Hypertensive retinopathy, right eye: Secondary | ICD-10-CM

## 2020-11-14 DIAGNOSIS — H33301 Unspecified retinal break, right eye: Secondary | ICD-10-CM | POA: Diagnosis not present

## 2020-12-02 DIAGNOSIS — Z23 Encounter for immunization: Secondary | ICD-10-CM | POA: Diagnosis not present

## 2020-12-12 ENCOUNTER — Encounter: Payer: Self-pay | Admitting: Adult Health

## 2020-12-12 ENCOUNTER — Ambulatory Visit (INDEPENDENT_AMBULATORY_CARE_PROVIDER_SITE_OTHER): Payer: Medicare Other | Admitting: Adult Health

## 2020-12-12 ENCOUNTER — Other Ambulatory Visit: Payer: Self-pay

## 2020-12-12 VITALS — BP 144/74 | HR 80 | Ht 67.0 in | Wt 172.0 lb

## 2020-12-12 DIAGNOSIS — H5461 Unqualified visual loss, right eye, normal vision left eye: Secondary | ICD-10-CM | POA: Diagnosis not present

## 2020-12-12 DIAGNOSIS — E785 Hyperlipidemia, unspecified: Secondary | ICD-10-CM | POA: Diagnosis not present

## 2020-12-12 DIAGNOSIS — E119 Type 2 diabetes mellitus without complications: Secondary | ICD-10-CM

## 2020-12-12 DIAGNOSIS — I10 Essential (primary) hypertension: Secondary | ICD-10-CM | POA: Diagnosis not present

## 2020-12-12 NOTE — Progress Notes (Signed)
[facility] [address] [place]. [place] [address] [phone]       HOSPITAL FOLLOW UP NOTE  [name] Date of Birth:  12-Feb-1951 Medical Record Number:  [id number]   Reason for Referral:  hospital follow up    SUBJECTIVE:   CHIEF COMPLAINT:  Chief Complaint  Patient presents with   Follow-up    Rm 3 with wif here for hospital f/u. Pt reports since d/c he has been doing well. Vision has remained stable since his d/c. Pt reports he would like discuss- some of the notes he has seen on his mychart along with left temple pain.    HISTORY:  [name] is a 70 y.o. male with history of ophthalmic disease starting in his 20's including left retinal detachment, pseudophakia of both eyes, vitreous hemorrhage of right eye and hypertensive retinopathy OU, as well as diagnoses of HTN, DM and HLD, who presented on 10/23/2020 for stroke evaluation to further assess etiology of new onset right vision loss on the advice of ophthalmology. The patient was administered an Avastin injection the week prior with some visual improvement.  Personally reviewed hospitalization progress notes, lab work and imaging.  Evaluated by Dr. Erlinda Hong for OD CRAO vs CRVO. MRI negative for acute stroke.  CTA head/neck negative LVO with approximately 50% stenosis bilateral VA's and additional mild atheromatous change about the aortic arch and carotid bifurcations without hemodynamically significant stenosis.  EF 55 to 60%.  LDL 89 - remained on Crestor.  A1c 7.4.  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  No therapy recommendations and advised follow-up with ophthalmology.   HPI:  Today, 12/12/2020, patient being seen for hospital follow-up accompanied by his wife. Overall doing well.  Continued OD central lower visual impairment which has been stable.  Routinely followed by ophthalmology receiving  monthly injections.  Returned back to all prior activities without difficulty.  Denies new stroke/TIA symptoms.  Remains on aspirin and Crestor without side effects.  Blood pressure today 144/74.  Concerned regarding continued slightly elevated blood pressures which has been ongoing.  He has follow-up with cardiology 11/2 and PCP 11/30 and plans on discussing further.  Mentions chronic occasional left temple pulsating pain which is short lasting - present over the past 10+ years. Wondering potential cause.  Mentions chronic essential tremor which can fluctuate. Per wife, his father had occasional tremors.  No further concerns at this time.     PERTINENT IMAGING  CT HEAD 10/23/2020 IMPRESSION: No acute intracranial findings.  CTA HEAD/NECK 10/24/2020 IMPRESSION: 1. Negative CTA for large vessel occlusion. 2. Atheromatous change at the origins of both vertebral arteries with associated moderate approximate 50% stenoses. 3. Additional mild atheromatous change about the aortic arch and carotid bifurcations without hemodynamically significant stenosis. 4. Subtle multifocal irregularity about the mid-distal cervical ICAs bilaterally, which could reflect atherosclerotic change versus mild changes of FMD.  MR BRAIN 10/24/2020 IMPRESSION: 1. No acute intracranial abnormality with normal for age non-contrast MRI appearance of the brain. 2. Negative routine MRI appearance of the right orbit. Left-side postoperative changes and phthisis bulbi.  2D ECHO 10/24/2020 IMPRESSIONS   1. Left ventricular ejection fraction, by estimation, is 55 to 60%. The  left ventricle has normal function. The left ventricle has no regional  wall motion abnormalities. Left ventricular diastolic parameters were  normal.   2. Right ventricular systolic function is normal. The right ventricular  size  is normal. Tricuspid regurgitation signal is inadequate for assessing  PA pressure.   3. The mitral valve is normal in  structure. Trivial mitral valve  regurgitation. No evidence of mitral stenosis.   4. The aortic valve is grossly normal. There is mild calcification of the  aortic valve. Aortic valve regurgitation is not visualized. No aortic  stenosis is present.   5. The inferior vena cava is normal in size with greater than 50%  respiratory variability, suggesting right atrial pressure of 3 mmHg.      ROS:   14 system review of systems performed and negative with exception of those listed in HPI  PMH:  Past Medical History:  Diagnosis Date   Chest pain    Recent episode of - stress Myoview study was normal   Diabetes mellitus    Hyperlipidemia    Hypertension    Hypertensive retinopathy    OU    PSH:  Past Surgical History:  Procedure Laterality Date   APPENDECTOMY  1970   CARDIOVASCULAR STRESS TEST  02-21-2010   EF 65%   CATARACT EXTRACTION Bilateral    EYE SURGERY     HERNIA REPAIR  69 years old   RETINAL DETACHMENT SURGERY Left    S/P SB, PPV    Social History:  Social History   Socioeconomic History   Marital status: Married    Spouse name: Not on file   Number of children: Not on file   Years of education: Not on file   Highest education level: Not on file  Occupational History   Not on file  Tobacco Use   Smoking status: Never   Smokeless tobacco: Never  Substance and Sexual Activity   Alcohol use: Yes    Comment: Only Socially   Drug use: Not on file   Sexual activity: Not on file  Other Topics Concern   Not on file  Social History Narrative   Not on file   Social Determinants of Health   Financial Resource Strain: Not on file  Food Insecurity: Not on file  Transportation Needs: Not on file  Physical Activity: Not on file  Stress: Not on file  Social Connections: Not on file  Intimate Partner Violence: Not on file    Family History:  Family History  Problem Relation Age of Onset   Hypertension Mother    Diabetes Father    Hyperlipidemia Brother     Diabetes Paternal Uncle    Diabetes Other     Medications:   Current Outpatient Medications on File Prior to Visit  Medication Sig Dispense Refill   acetaminophen (TYLENOL) 325 MG tablet Take 650 mg by mouth every 6 (six) hours as needed for moderate pain.     albuterol (VENTOLIN HFA) 108 (90 Base) MCG/ACT inhaler Inhale 1-2 puffs into the lungs every 4 (four) hours as needed for shortness of breath or wheezing.     aspirin 81 MG tablet Take 81 mg by mouth daily.       Cholecalciferol (VITAMIN D PO) Take 1 capsule by mouth daily.      Dulaglutide 0.75 MG/0.5ML SOPN Inject 0.75 mg into the skin every Monday.     empagliflozin (JARDIANCE) 25 MG TABS tablet Take 25 mg by mouth daily.     metFORMIN (GLUCOPHAGE-XR) 500 MG 24 hr tablet Take 2 tablets (1,000 mg total) by mouth 2 (two) times daily.     olmesartan (BENICAR) 40 MG tablet Take 40 mg by mouth daily.  pioglitazone (ACTOS) 30 MG tablet Take 1 tablet by mouth daily.     Polyethyl Glycol-Propyl Glycol 0.4-0.3 % SOLN Place 1 drop into the left eye as needed.     rosuvastatin (CRESTOR) 40 MG tablet Take 40 mg by mouth at bedtime.  3   valACYclovir (VALTREX) 1000 MG tablet Take 2,000 mg by mouth as needed (fever blister).     No current facility-administered medications on file prior to visit.    Allergies:   Allergies  Allergen Reactions   Other     Steroid eyedrop      OBJECTIVE:  Physical Exam  Vitals:   12/12/20 0905  BP: (!) 144/74  Pulse: 80  SpO2: 98%  Weight: 172 lb (78 kg)  Height: 5\' 7"  (1.702 m)   Body mass index is 26.94 kg/m. No results found.  General: well developed, well nourished, very pleasant middle-age Caucasian male, seated, in no evident distress Head: head normocephalic and atraumatic.   Neck: supple with no carotid or supraclavicular bruits Cardiovascular: regular rate and rhythm, no murmurs Musculoskeletal: no deformity Skin:  no rash/petichiae Vascular:  Normal pulses all  extremities   Neurologic Exam Mental Status: Awake and fully alert. Oriented to place and time. Recent and remote memory intact. Attention span, concentration and fund of knowledge appropriate. Mood and affect appropriate.  Cranial Nerves: OS blind (chroinc). OD visual field intact, lower central vision decreased acuity.  Extraocular movements full without nystagmus. Hearing intact. Facial sensation intact. Face, tongue, palate moves normally and symmetrically.  Motor: Normal bulk and tone. Normal strength in all tested extremity muscles Sensory.: intact to touch , pinprick , position and vibratory sensation.  Coordination: Rapid alternating movements normal in all extremities. Finger-to-nose and heel-to-shin performed accurately bilaterally. Mild intention tremor L>R.  No evidence of tremor at rest, bradykinesia or cogwheel rigidity Gait and Station: Arises from chair without difficulty. Stance is normal. Gait demonstrates normal stride length and balance without use of assistive device.  Reflexes: 1+ and symmetric. Toes downgoing.         ASSESSMENT: Anthony Brennan is a 70 y.o. year old male with OD CRAO vs CRVO on 10/23/2020 in setting of new onset OD vision loss. Vascular risk factors include HTN, HLD and DM.  Extensive ophthalmology disease history with left retinal detachment, pseudophakia of both eyes, vitreous hemorrhage of right eye and hypertensive retinopathy OU.      PLAN:  OD CRAO vs CRVO :  Residual deficit: OD lower central vision decreased acuity.  Routinely followed by ophthalmology Dr. Zigmund Daniel Continue aspirin 81 mg daily  and Crestor 40 mg daily for secondary stroke prevention.   Discussed stroke prevention measures and importance of close PCP follow up for aggressive stroke risk factor management. I have gone over the pathophysiology of recent event, stroke warning signs and symptoms, risk factors and their management in some detail with instructions to go to the closest  emergency room for symptoms of concern. HTN: BP goal <130/90.  Stable on slightly higher on current regimen per PCP HLD: LDL goal <70. Recent LDL 89 on Crestor 40 mg daily per PCP DMII: A1c goal<7.0. Recent A1c 7.4 on Jardiance, Trulicity, metformin and Actos per PCP Left temporal pain: chronic- present over 10+ years.  Unknown exact etiology. Denies worsening or new symptoms. As chronic nature, no suspicion for temporal arteritis.  Possibly migraine type headache but does not last for long duration. Unknown if related to chronic ophthalmology issues.  Monitored by PCP Tremor: Chronic.  Seems  to be more consistent with essential tremor.  Family history of tremor.  No evidence of Parkinson's.  Does not interfere with daily activity or functioning.  Continue to be monitored by PCP    Per patient request, may follow-up on an as-needed basis as he is routinely followed by PCP, cardiology and ophthalmology for routine monitoring and aggressive risk factor management.  He was advised to call with any questions or concerns.   CC:  [facility] provider: Dr. Leonie Man PCP: Tisovec, Fransico Him, MD    I spent 54 minutes of face-to-face and non-face-to-face time with patient and his wife.  This included previsit chart review including review of recent hospitalization, lab review, study review, electronic health record documentation, patient and wife education regarding recent visual loss including etiology, stroke prevention measures and importance of managing stroke risk factors, residual deficits and routine follow-up with ophthalmology, chronic left temporal pain and tremor and answered all other questions to patient and wife's satisfaction   Anthony Brennan, AGNP-BC  [facility] [address] [place], [place] [address]  Phone [phone] Fax [phone] Note: This document was prepared with digital dictation and possible smart phrase technology. Any transcriptional errors that  result from this process are unintentional.

## 2020-12-12 NOTE — Patient Instructions (Signed)
Continue aspirin 81 mg daily  and Crestor 40mg  daily  for secondary stroke prevention  Continue to follow up with PCP regarding cholesterol, blood pressure and diabetes management  Maintain strict control of hypertension with blood pressure goal below 130/90, diabetes with hemoglobin A1c goal below 7% and cholesterol with LDL cholesterol (bad cholesterol) goal below 70 mg/dL.        Thank you for coming to see [place] at [facility]. I hope we have been able to provide you high quality care today.  You may receive a patient satisfaction survey over the next few weeks. We would appreciate your feedback and comments so that we may continue to improve ourselves and the health of our patients.

## 2020-12-13 ENCOUNTER — Other Ambulatory Visit: Payer: Self-pay

## 2020-12-13 ENCOUNTER — Encounter (INDEPENDENT_AMBULATORY_CARE_PROVIDER_SITE_OTHER): Payer: Medicare Other | Admitting: Ophthalmology

## 2020-12-13 DIAGNOSIS — H35031 Hypertensive retinopathy, right eye: Secondary | ICD-10-CM | POA: Diagnosis not present

## 2020-12-13 DIAGNOSIS — I10 Essential (primary) hypertension: Secondary | ICD-10-CM | POA: Diagnosis not present

## 2020-12-13 DIAGNOSIS — H43811 Vitreous degeneration, right eye: Secondary | ICD-10-CM | POA: Diagnosis not present

## 2020-12-13 DIAGNOSIS — H338 Other retinal detachments: Secondary | ICD-10-CM

## 2020-12-13 DIAGNOSIS — H33301 Unspecified retinal break, right eye: Secondary | ICD-10-CM

## 2020-12-13 DIAGNOSIS — H34811 Central retinal vein occlusion, right eye, with macular edema: Secondary | ICD-10-CM | POA: Diagnosis not present

## 2020-12-21 NOTE — Progress Notes (Signed)
I agree with the above plan 

## 2021-01-02 ENCOUNTER — Encounter: Payer: Self-pay | Admitting: Cardiovascular Disease

## 2021-01-02 NOTE — Progress Notes (Signed)
Cardiology Office Note   Date:  01/03/2021   ID:  Anthony Brennan, DOB [birth year], MRN [id number]  PCP:  Anthony Pao, MD  Cardiologist:   Anthony Moores, MD   Chief Complaint  Patient presents with   Hypertension   Chest Pain    Problem List:   1. HTN 2. Hyperlipidemia 3. DM     Anthony Brennan is a middle age gentleman with a hx of HTN and hyperlipidemia, DM.  He has done well .  June 02, 2012:  Anthony Brennan is doing well.  No CP or dyspnea.  Several dizzy spells yesterday - one episode occurred while he was sitting at his desk .  He has just started walking again this spring.     Jul 27, 2014 : Anthony Brennan is a 70 y.o. male who presents for his HTN. Walking regularly.  No CP or dyspnea   Jul 19, 2015:  Doing well.    BP is regular at home .  August 14, 2017:  Anthony Brennan is seen back today for follow-up visit.  He has a history of hypertension and hyperlipidemia.  He was last seen 2 years ago. Has had some bruising  Exercising some.    Still working in the [employer].   Had labs drawn at [facility]. Glucose was mildly elevated at 112.  Creatinine is normal at 0.8.  Sodium and potassium are normal.  Liver enzymes are normal.  White blood cell count is normal.  Apoprotein B is normal.  PSA was 1.634.  HDL is 51.  LDL 76.  Total cholesterol is 145.  Triglyceride level is 88.  August 04, 2019:  Anthony Brennan is seen today for follow-up visit for his hypertension and hyperlipidemia.  He was seen approximately 2 years ago. He brought labs with him from his primary MD  CHol is 136 Trigs = 91 HDL = 46 LDL = 72  Doing well No cp.  No dyspnea.   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   BP is a bit elevated here today .  BP is usually 120's   jUne, 22, 2022  Anthony Brennan is seen today for follow up of his HTN, HLD  Had some chest pain while at the beach last year Was eating ( too fast he said)  Severe CP , pressure  Took several deep breaths,  drank water  He thought some food  was stuck in his esophagus.  Anthony Brennan ( wife)  asked him to go to the hospital - he did not  Did see GI ( Buccini)  Barium swallow showed  small hiatal hernia  Is scheduled for EGD .  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   Labs from his primary medical doctor in April, 2022 reveals a total cholesterol of 168 Triglyceride level is 78 HDL is 70 LDL is 82.  Nov. 2, 2022; Anthony Brennan is seen today for follow up visit Anthony Brennan was hospitalized on Aug. 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  Will have him see the lipid clinic for consideration of additionad therapy ( zetia vs PCSK 9 , vs Inclisaran )   His primary MD changed his Altace to Olmesartan 40 QD   Still eats bacon and sausage - encouraged him to avoid these .  Still gets some exercise  Encouraged him to walk more   Past Medical History:  Diagnosis Date   Chest pain    Recent episode of - stress Myoview study was normal   Diabetes mellitus    Hyperlipidemia    Hypertension    Hypertensive retinopathy    OU    Past Surgical History:  Procedure Laterality Date   APPENDECTOMY  1970   CARDIOVASCULAR STRESS TEST  02-21-2010   EF 65%   CATARACT EXTRACTION Bilateral    EYE SURGERY     HERNIA REPAIR  70 years old   RETINAL DETACHMENT SURGERY Left    S/P SB, PPV     Current Outpatient Medications  Medication Sig Dispense Refill   acetaminophen (TYLENOL) 325 MG tablet Take 650 mg by mouth every 6 (six) hours as needed for moderate pain.     albuterol (VENTOLIN HFA) 108 (90 Base) MCG/ACT inhaler Inhale 1-2 puffs into the lungs every 4  (four) hours as needed for shortness of breath or wheezing.     aspirin 81 MG tablet Take 81 mg by mouth daily.       Cholecalciferol (VITAMIN D PO) Take 1 capsule by mouth daily.      Dulaglutide 0.75 MG/0.5ML SOPN Inject 0.75 mg into the skin every Monday.     empagliflozin (JARDIANCE) 25 MG TABS tablet Take 25 mg by mouth daily.     famotidine (PEPCID) 40 MG tablet Take 40 mg by mouth daily as needed.     hydrochlorothiazide (HYDRODIURIL) 25 MG tablet Take 1 tablet (25 mg total) by mouth daily. 90 tablet 3   metFORMIN (GLUCOPHAGE-XR) 500 MG 24 hr tablet Take 2 tablets (1,000 mg total) by mouth 2 (two) times daily.     olmesartan (BENICAR) 40 MG tablet Take 40 mg by mouth daily.     pioglitazone (ACTOS) 30 MG tablet Take 1 tablet by mouth daily.     Polyethyl Glycol-Propyl Glycol 0.4-0.3 % SOLN Place 1 drop into the left eye as needed.     potassium chloride (KLOR-CON) 10 MEQ tablet Take 1 tablet (10 mEq total) by mouth 2 (two) times daily. 180 tablet 3   rosuvastatin (CRESTOR) 40 MG tablet Take 40 mg by mouth at bedtime.  3   valACYclovir (VALTREX) 1000 MG tablet Take 2,000 mg by mouth as needed (fever blister).     No current facility-administered medications for this visit.    Allergies:   Other    Social History:  The patient  reports that he has never smoked. He has never used smokeless tobacco. He reports current alcohol use.   Family History:  The patient's family history includes Diabetes in his father, paternal uncle, and another family member; Hyperlipidemia in his brother; Hypertension in his mother.    ROS:    Reviewed and current history, otherwise review of systems is negative.  Physical Exam: Blood pressure 140/76, pulse 84, height 5\' 7"  (1.702 m), weight 173 lb 3.2 oz (78.6 kg), SpO2 99 %.  GEN:  Well nourished, well developed in no acute distress HEENT: Normal NECK: No JVD; No carotid bruits LYMPHATICS: No lymphadenopathy CARDIAC: RRR , no murmurs, rubs,  gallops RESPIRATORY:  Clear to auscultation without rales, wheezing or rhonchi  ABDOMEN: Soft, non-tender, non-distended MUSCULOSKELETAL:  No edema; No deformity  SKIN: Warm and dry NEUROLOGIC:  Alert and oriented x 3   EKG:        Recent Labs: 10/23/2020: ALT 18; BUN 22; Potassium 4.3; Sodium 138 10/24/2020: Creatinine, Ser 0.88; Hemoglobin 15.0; Platelets 233    Lipid Panel    Component Value Date/Time   CHOL 180 10/24/2020 0730   TRIG 137 10/24/2020 0730   HDL 64 10/24/2020 0730   CHOLHDL 2.8 10/24/2020 0730   VLDL 27 10/24/2020 0730   LDLCALC 89 10/24/2020 0730      Wt Readings from Last 3 Encounters:  01/03/21 173 lb 3.2 oz (78.6 kg)  12/12/20 172 lb (78 kg)  10/23/20 175 lb (79.4 kg)      Other studies Reviewed: Additional studies/ records that were reviewed today include: . Review of the above records demonstrates:    ASSESSMENT AND PLAN:   Chest pain :     2. Hyperlipidemia - last LDL is 89.   Given his visual troubles and the possibility of retinal artery versus retinal venous occlusion, I think we need to be very aggressive with his lipid-lowering.  I set his LDL goal is between 50 and 70.  I will have him see our lipid clinic to discuss further options.  He may be able to get to goal using Zetia.  I would have a very low threshold to put him on a PCSK9 inhibitor versus inclisaran . .  4.   HTN-    Blood pressure still mildly elevated.  He admits to eating some extra salty foods.  We will add HCTZ 25 mg a day and potassium chloride 10 mill equivalents twice a day.  He says he has tried HCTZ in the past but did not like it because it made him go to the bathroom too much.  I reminded him that this was part of the process.  If he is not able to take a whole HCTZ perhaps he can still get some benefit from HCTZ 12.5 mg a day.  We will see him again in 3 months for follow-up visit.  Current medicines are reviewed at length with the patient today.  The patient  does not have concerns regarding medicines.  The following changes have been made:  no change  Labs/ tests ordered today include:   Orders Placed This Encounter  Procedures   Basic metabolic panel   AMB Referral to [facility]     Disposition:   I will see him in 3 months ( with me or APP)     Anthony Moores, MD  01/03/2021 11:38 AM    [facility] [address], [place], [place]  [address] Phone: [phone]; Fax: [phone]

## 2021-01-03 ENCOUNTER — Encounter: Payer: Self-pay | Admitting: Cardiovascular Disease

## 2021-01-03 ENCOUNTER — Other Ambulatory Visit: Payer: Self-pay

## 2021-01-03 ENCOUNTER — Ambulatory Visit (INDEPENDENT_AMBULATORY_CARE_PROVIDER_SITE_OTHER): Payer: Medicare Other | Admitting: Cardiovascular Disease

## 2021-01-03 VITALS — BP 140/76 | HR 84 | Ht 67.0 in | Wt 173.2 lb

## 2021-01-03 DIAGNOSIS — I10 Essential (primary) hypertension: Secondary | ICD-10-CM

## 2021-01-03 DIAGNOSIS — E782 Mixed hyperlipidemia: Secondary | ICD-10-CM | POA: Diagnosis not present

## 2021-01-03 MED ORDER — HYDROCHLOROTHIAZIDE 25 MG PO TABS: 25.0000 mg | ORAL_TABLET | Freq: Every day | ORAL | 3 refills | Status: DC

## 2021-01-03 MED ORDER — POTASSIUM CHLORIDE ER 10 MEQ PO TBCR: 10.0000 meq | EXTENDED_RELEASE_TABLET | Freq: Two times a day (BID) | ORAL | 3 refills | Status: DC

## 2021-01-03 NOTE — Patient Instructions (Signed)
Medication Instructions:  1) START Hydrochlorothiazide 25mg  once daily  2) START Potassium Chloride 73meq twice daily  *If you need a refill on your cardiac medications before your next appointment, please call your pharmacy*   Lab Work: BMET in 2-3 weeks  If you have labs (blood work) drawn today and your tests are completely normal, you will receive your results only by: [facility] (if you have MyChart) OR A paper copy in the mail If you have any lab test that is abnormal or we need to change your treatment, we will call you to review the results.   Testing/Procedures: None   Follow-Up:  Your physician recommends that you schedule a follow-up appointment in: our [facility] to discuss cholesterol options.  At [facility], you and your health needs are our priority.  As part of our continuing mission to provide you with exceptional heart care, we have created designated Provider Care Teams.  These Care Teams include your primary Cardiologist (physician) and Advanced Practice Providers (APPs -  Physician Assistants and Nurse Practitioners) who all work together to provide you with the care you need, when you need it.  We recommend signing up for the patient portal called "MyChart".  Sign up information is provided on this After Visit Summary.  MyChart is used to connect with patients for Virtual Visits (Telemedicine).  Patients are able to view lab/test results, encounter notes, upcoming appointments, etc.  Non-urgent messages can be sent to your provider as well.   To learn more about what you can do with MyChart, go to [email or web address].    Your next appointment:   3 month(s)  The format for your next appointment:   In Person  Provider:   You may see Mertie Moores, MD or one of the following Advanced Practice Providers on your designated Care Team:   Richardson Dopp, PA-C Robbie Lis, [place]   Other Instructions

## 2021-01-10 ENCOUNTER — Other Ambulatory Visit: Payer: Self-pay

## 2021-01-10 ENCOUNTER — Encounter (INDEPENDENT_AMBULATORY_CARE_PROVIDER_SITE_OTHER): Payer: Medicare Other | Admitting: Ophthalmology

## 2021-01-10 DIAGNOSIS — H33301 Unspecified retinal break, right eye: Secondary | ICD-10-CM

## 2021-01-10 DIAGNOSIS — H348112 Central retinal vein occlusion, right eye, stable: Secondary | ICD-10-CM | POA: Diagnosis not present

## 2021-01-10 DIAGNOSIS — H35031 Hypertensive retinopathy, right eye: Secondary | ICD-10-CM

## 2021-01-10 DIAGNOSIS — I10 Essential (primary) hypertension: Secondary | ICD-10-CM | POA: Diagnosis not present

## 2021-01-10 DIAGNOSIS — H338 Other retinal detachments: Secondary | ICD-10-CM | POA: Diagnosis not present

## 2021-01-10 DIAGNOSIS — H43811 Vitreous degeneration, right eye: Secondary | ICD-10-CM | POA: Diagnosis not present

## 2021-01-16 ENCOUNTER — Other Ambulatory Visit: Payer: Medicare Other

## 2021-01-16 ENCOUNTER — Ambulatory Visit: Payer: Medicare Other

## 2021-01-18 DIAGNOSIS — E559 Vitamin D deficiency, unspecified: Secondary | ICD-10-CM | POA: Diagnosis not present

## 2021-01-18 DIAGNOSIS — E663 Overweight: Secondary | ICD-10-CM | POA: Diagnosis not present

## 2021-01-18 DIAGNOSIS — E7800 Pure hypercholesterolemia, unspecified: Secondary | ICD-10-CM | POA: Diagnosis not present

## 2021-01-18 DIAGNOSIS — H348112 Central retinal vein occlusion, right eye, stable: Secondary | ICD-10-CM | POA: Diagnosis not present

## 2021-01-18 DIAGNOSIS — I10 Essential (primary) hypertension: Secondary | ICD-10-CM | POA: Diagnosis not present

## 2021-01-18 DIAGNOSIS — E119 Type 2 diabetes mellitus without complications: Secondary | ICD-10-CM | POA: Diagnosis not present

## 2021-01-18 DIAGNOSIS — M19049 Primary osteoarthritis, unspecified hand: Secondary | ICD-10-CM | POA: Diagnosis not present

## 2021-01-18 DIAGNOSIS — G250 Essential tremor: Secondary | ICD-10-CM | POA: Diagnosis not present

## 2021-01-18 DIAGNOSIS — H35033 Hypertensive retinopathy, bilateral: Secondary | ICD-10-CM | POA: Diagnosis not present

## 2021-01-19 ENCOUNTER — Ambulatory Visit (INDEPENDENT_AMBULATORY_CARE_PROVIDER_SITE_OTHER): Payer: Medicare Other | Admitting: Pharmacist

## 2021-01-19 ENCOUNTER — Other Ambulatory Visit: Payer: Medicare Other | Admitting: *Deleted

## 2021-01-19 ENCOUNTER — Other Ambulatory Visit: Payer: Self-pay

## 2021-01-19 ENCOUNTER — Telehealth: Payer: Self-pay

## 2021-01-19 DIAGNOSIS — I10 Essential (primary) hypertension: Secondary | ICD-10-CM | POA: Diagnosis not present

## 2021-01-19 DIAGNOSIS — E782 Mixed hyperlipidemia: Secondary | ICD-10-CM

## 2021-01-19 DIAGNOSIS — E119 Type 2 diabetes mellitus without complications: Secondary | ICD-10-CM

## 2021-01-19 LAB — BASIC METABOLIC PANEL
BUN/Creatinine Ratio: 26 — ABNORMAL HIGH (ref 10–24)
BUN: 27 mg/dL (ref 8–27)
CO2: 26 mmol/L (ref 20–29)
Calcium: 9.3 mg/dL (ref 8.6–10.2)
Chloride: 101 mmol/L (ref 96–106)
Creatinine, Ser: 1.03 mg/dL (ref 0.76–1.27)
Glucose: 153 mg/dL — ABNORMAL HIGH (ref 70–99)
Potassium: 4.4 mmol/L (ref 3.5–5.2)
Sodium: 140 mmol/L (ref 134–144)
eGFR: 79 mL/min/{1.73_m2} (ref >59)

## 2021-01-19 NOTE — Progress Notes (Signed)
Patient ID: Anthony Brennan                 DOB: January 31, 1951                    MRN: [id number]     HPI: Anthony Brennan is a 70 y.o. male patient referred to lipid clinic by Dr. Acie Fredrickson. 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 Acie Fredrickson would like pt LDL to be between 50-70.  Patient presents today to lipid clinic. He is accompanied by his wife. He is retired. Still somewhat active. Walks sometimes. Is tolerating rosuvastatin 40mg  fine. Wife is concerned about the cost of all his medications. Diet is high in starch, white flours.   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 Acie Fredrickson  Diet:  Breakfast: glucerna or biscuit Lunch: cheese burger, french fries, hamburger steak, sandwich, spaghetti and meatballs, frozen burrito Dinner: similar to lunch Drink: water Snack: pop-corn, burrito, PB on crackers  Exercise: walks occassionally, puts around at farm, rakes leaves  Family History:  The patient's family history includes Diabetes in his father, paternal uncle, and another family member; Hyperlipidemia in his brother; Hypertension in his mother.   Social History:  The patient  reports that he has never smoked. He has never used smokeless tobacco. He reports current alcohol use. 3-4 beers/day  Labs:10/24/20 TC 180, TG 137, HDL 64, LDL 89 (rosuvastatin 40mg  daily)  Past Medical History:  Diagnosis Date   Chest pain    Recent episode of - stress Myoview study was normal   Diabetes mellitus    Hyperlipidemia    Hypertension    Hypertensive retinopathy    OU    Current Outpatient Medications on File Prior to Visit  Medication Sig Dispense Refill   acetaminophen (TYLENOL) 325 MG tablet Take 650 mg by mouth every 6 (six) hours as needed for moderate pain.     albuterol (VENTOLIN HFA) 108 (90  Base) MCG/ACT inhaler Inhale 1-2 puffs into the lungs every 4 (four) hours as needed for shortness of breath or wheezing.     aspirin 81 MG tablet Take 81 mg by mouth daily.       Cholecalciferol (VITAMIN D PO) Take 1 capsule by mouth daily.      Dulaglutide 0.75 MG/0.5ML SOPN Inject 0.75 mg into the skin every Monday.     empagliflozin (JARDIANCE) 25 MG TABS tablet Take 25 mg by mouth daily.     famotidine (PEPCID) 40 MG tablet Take 40 mg by mouth daily as needed.     hydrochlorothiazide (HYDRODIURIL) 25 MG tablet Take 1 tablet (25 mg total) by mouth daily. 90 tablet 3   metFORMIN (GLUCOPHAGE-XR) 500 MG 24 hr tablet Take 2 tablets (1,000 mg total) by mouth 2 (two) times daily.     olmesartan (BENICAR) 40 MG tablet Take 40 mg by mouth daily.     pioglitazone (ACTOS) 30 MG tablet Take 1 tablet by mouth daily.     Polyethyl Glycol-Propyl Glycol 0.4-0.3 % SOLN Place 1 drop into the left eye as needed.     potassium chloride (KLOR-CON) 10 MEQ tablet Take 1 tablet (10 mEq total) by mouth 2 (two) times daily. 180 tablet 3   rosuvastatin (CRESTOR) 40 MG tablet Take 40 mg by mouth at bedtime.  3  valACYclovir (VALTREX) 1000 MG tablet Take 2,000 mg by mouth as needed (fever blister).     No current facility-administered medications on file prior to visit.    Allergies  Allergen Reactions   Other     Steroid eyedrop    Assessment/Plan:  1. Hyperlipidemia - LDL is above goal of <70. We discussed the importance of diet on blood sugar and cholesterol. Patient's wife is concerned about all the medications he takes. Every time he goes to the doctor they add medication. She is concerned about the cost and the effects on him. We did talk about how, if he improved him diet there was a good chance he could come off of some of his diabetes medications. I did review some changes he could incorporate, list of helpful resources and I referred him to the [facility] prep class. We did discuss ezetimibe vs PCSK9i. We  dicussed that ezetimibe might not get him to goal. He is already on 2 other branded medications. Even if we use the [employer] grant for PCSK9i copay, it will still push him into the coverage gap sooner. Patient requested to do some research and get back with me.    Thank you,   Ramond Dial, Pharm.D, BCPS, CPP [place]  [address], [place], [place] [address]  Phone: [phone]; Fax: [phone]

## 2021-01-19 NOTE — Telephone Encounter (Signed)
Called to discuss PREP program, left voicemail ? ?

## 2021-01-19 NOTE — Patient Instructions (Addendum)
Ezetimibe- lowers LDL about 20%- pill  Praluent or Repatha- lowers LDL up to 60%-injection  Good resources:  Eat. Sleep. Move. Breath: The Beginner's Guide to Living A Healthy Lifestyle  Eat, Drink, and Be Healthy: The [employer] Guide to Healthy Eating (2007 version)  Zoe podcast  Call me at [phone] with any questions   Tips for living a healthier life     Building a Healthy and Balanced Diet Make most of your meal vegetables and fruits -  of your plate. Aim for color and variety, and remember that potatoes don't count as vegetables on the Healthy Eating Plate because of their negative impact on blood sugar.  Go for whole grains -  of your plate. Whole and intact grains--whole wheat, barley, wheat berries, quinoa, oats, brown rice, and foods made with them, such as whole wheat pasta--have a milder effect on blood sugar and insulin than white bread, white rice, and other refined grains.  Protein power -  of your plate. Fish, poultry, beans, and nuts are all healthy, versatile protein sources--they can be mixed into salads, and pair well with vegetables on a plate. Limit red meat, and avoid processed meats such as bacon and sausage.  Healthy plant oils - in moderation. Choose healthy vegetable oils like olive, canola, soy, corn, sunflower, peanut, and others, and avoid partially hydrogenated oils, which contain unhealthy trans fats. Remember that low-fat does not mean "healthy."  Drink water, coffee, or tea. Skip sugary drinks, limit milk and dairy products to one to two servings per day, and limit juice to a small glass per day.  Stay active. The red figure running across the Peak is a reminder that staying active is also important in weight control.  The main message of the Healthy Eating Plate is to focus on diet quality:  The type of carbohydrate in the diet is more important than the amount of carbohydrate in the diet,  because some sources of carbohydrate--like vegetables (other than potatoes), fruits, whole grains, and beans--are healthier than others. The Healthy Eating Plate also advises consumers to avoid sugary beverages, a major source of calories--usually with little nutritional value--in the American diet. The Healthy Eating Plate encourages consumers to use healthy oils, and it does not set a maximum on the percentage of calories people should get each day from healthy sources of fat. In this way, the Healthy Eating Plate recommends the opposite of the low-fat message promoted for decades by the USDA.  DeskDistributor.no  SUGAR  Sugar is a huge problem in the modern day diet. Sugar is a big contributor to heart disease, diabetes, high triglyceride levels, fatty liver disease and obesity. Sugar is hidden in almost all packaged foods/beverages. Added sugar is extra sugar that is added beyond what is naturally found and has no nutritional benefit for your body. The American Heart Association recommends limiting added sugars to no more than 25g for women and 36 grams for men per day. There are many names for sugar including maltose, sucrose (names ending in "ose"), high fructose corn syrup, molasses, cane sugar, corn sweetener, raw sugar, syrup, honey or fruit juice concentrate.   One of the best ways to limit your added sugars is to stop drinking sweetened beverages such as soda, sweet tea, and fruit juice.  There is 65g of added sugars in one 20oz bottle of Coke! That is equal to 7.5 donuts.   Pay attention and read all nutrition facts labels. Below is an examples of  a nutrition facts label. The #1 is showing you the total sugars where the # 2 is showing you the added sugars. This one serving has almost the max amount of added sugars per day!     20 oz Soda 65g Sugar = 7.5 Glazed Donuts  16oz Energy  Drink 54g Sugar = 6.5 Glazed Donuts  Large Sweet   Tea 38g Sugar = 4 Glazed Donuts  20oz Sports  Drink 34g Sugar = 3.5 Glazed Donuts  8oz Chocolate Milk 24g Sugar =2.5 Glazed Donuts  8oz Orange  Juice 21g Sugar = 2 Glazed Donuts  1 Juice Box 14g Sugar = 1.5 Glazed Donuts  16oz Water= NO SUGAR!!  EXERCISE  Exercise is good. We've all heard that. In an ideal world, we would all have time and resources to get plenty of it. When you are active, your heart pumps more efficiently and you will feel better.  Multiple studies show that even walking regularly has benefits that include living a longer life. The American Heart Association recommends 150 minutes per week of exercise (30 minutes per day most days of the week). You can do this in any increment you wish. Nine or more 10-minute walks count. So does an hour-long exercise class. Break the time apart into what will work in your life. Some of the best things you can do include walking briskly, jogging, cycling or swimming laps. Not everyone is ready to "exercise." Sometimes we need to start with just getting active. Here are some easy ways to be more active throughout the day:  Take the stairs instead of the elevator  Go for a 10-15 minute walk during your lunch break (find a friend to make it more enjoyable)  When shopping, park at the back of the parking lot  If you take public transportation, get off one stop early and walk the extra distance  Pace around while making phone calls  Check with your doctor if you aren't sure what your limitations may be. Always remember to drink plenty of water when doing any type of exercise. Don't feel like a failure if you're not getting the 90-150 minutes per week. If you started by being a couch potato, then just a 10-minute walk each day is a huge improvement. Start with little victories and work your way up.   HEALTHY EATING TIPS  When looking to improve your eating habits, whether to lose weight, lower blood pressure or just be healthier, it  helps to know what a serving size is.   Grains 1 slice of bread,  bagel,  cup pasta or rice  Vegetables 1 cup fresh or raw vegetables,  cup cooked or canned Fruits 1 piece of medium sized fruit,  cup canned,   Meats/Proteins  cup dried       1 oz meat, 1 egg,  cup cooked beans, nuts or seeds  Dairy        Fats Individual yogurt container, 1 cup (8oz)    1 teaspoon margarine/butter or vegetable  milk or milk alternative, 1 slice of cheese          oil; 1 tablespoon mayonnaise or salad dressing                  Plan ahead: make a menu of the meals for a week then create a grocery list to go with that menu. Consider meals that easily stretch into a night of leftovers, such as stews or casseroles. Or consider making two of  your favorite meal and put one in the freezer for another night. Try a night or two each week that is "meatless" or "no cook" such as salads. When you get home from the grocery store wash and prepare your vegetables and fruits. Then when you need them they are ready to go.   Tips for going to the grocery store:  [place] store or generic brands  Check the weekly ad from your store on-line or in their in-store flyer  Look at the unit price on the shelf tag to compare/contrast the costs of different items  Buy fruits/vegetables in season  Carrots, bananas and apples are low-cost, naturally healthy items  If meats or frozen vegetables are on sale, buy some extras and put in your freezer  Limit buying prepared or "ready to eat" items, even if they are pre-made salads or fruit snacks  Do not shop when you're hungry  Foods at eye level tend to be more expensive. Look on the high and low shelves for deals.  Consider shopping at the farmer's market for fresh foods in season.  Avoid the cookie and chip aisles (these are expensive, high in calories and low in nutritional value). Shop on the outside of the grocery store.  Healthy food preparations:  If you can't get lean hamburger,  be sure to drain the fat when cooking  Steam, saut (in olive oil), grill or bake foods  Experiment with different seasonings to avoid adding salt to your foods. Kosher salt, sea salt and Himalayan salt are all still salt and should be avoided. Try seasoning food with onion, garlic, thyme, rosemary, basil ect. Onion powder or garlic powder is ok. Avoid if it says salt (ie garlic salt).

## 2021-01-24 ENCOUNTER — Telehealth: Payer: Self-pay

## 2021-01-24 NOTE — Telephone Encounter (Signed)
Called to discuss PREP, offered next class at [facility] December 12 11:30-12:45; wants to think about it, would like a follow up call back closer to class start date.

## 2021-01-29 ENCOUNTER — Telehealth: Payer: Self-pay

## 2021-01-29 NOTE — Telephone Encounter (Signed)
Called to confirm he wants to attend next PREP class at [facility] staring 12/12 every M/W 11:30-12:45; assessment visit scheduled for 12/5 at 10:30am

## 2021-02-05 NOTE — Progress Notes (Signed)
[facility] PREP Evaluation  Patient Details  Name: Anthony Brennan MRN: [id number] Date of Birth: 21-Jun-1950 Age: 70 y.o. PCP: Haywood Pao, MD  Vitals:   02/05/21 1111  BP: 122/74  Pulse: 79  SpO2: 99%  Weight: 169 lb 6.4 oz (76.8 kg)     [facility] Eval - 02/05/21 1100       [facility] "PREP" Location   [facility] "PREP" Location [place]      Referral    Referring Provider Rhodell    Reason for referral High Cholesterol;Hypertension;Diabetes    Program Start Date 02/12/21      Measurement   Neck measurement 16.5 Inches    Waist Circumference 38.5 inches      Information for Trainer   Goals --   BP goal less than 120/80; A1C 6.5 or less   Current Exercise --   walk, work on farm   Orthopedic Concerns --   low back discomfort; bursitis in hips   Pertinent Medical History --   HTN, high cholesterol, diabetes   Current Barriers --   personal schedule conflicts     Timed Up and Go (TUGS)   Timed Up and Go Low risk <9 seconds      Mobility and Daily Activities   I find it easy to walk up or down two or more flights of stairs. 4    I have no trouble taking out the trash. 4    I do housework such as vacuuming and dusting on my own without difficulty. 1    I can easily lift a gallon of milk (8lbs). 4    I can easily walk a mile. 3    I have no trouble reaching into high cupboards or reaching down to pick up something from the floor. 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 4      Mobility and Daily Activities   I feel younger than my age. 4    I feel independent. 4    I feel energetic. 3    I live an active life.  4    I feel strong. 4    I feel healthy. 4    I feel active as other people my age. 4      How fit and strong are you.   Fit and Strong Total Score 51            Past Medical History:  Diagnosis Date   Chest pain    Recent episode of - stress Myoview study was normal   Diabetes mellitus     Hyperlipidemia    Hypertension    Hypertensive retinopathy    OU   Past Surgical History:  Procedure Laterality Date   APPENDECTOMY  1970   CARDIOVASCULAR STRESS TEST  02-21-2010   EF 65%   CATARACT EXTRACTION Bilateral    EYE SURGERY     HERNIA REPAIR  70 years old   RETINAL DETACHMENT SURGERY Left    S/P SB, PPV   Social History   Tobacco Use  Smoking Status Never  Smokeless Tobacco Never  To begin PREP class at [facility] 12/12 every M/W 11:30-12:45  [name] 02/05/2021, 11:17 AM

## 2021-02-12 NOTE — Progress Notes (Signed)
[facility] PREP Weekly Session  Patient Details  Name: Anthony Brennan MRN: [id number] Date of Birth: September 07, 1950 Age: 70 y.o. PCP: Haywood Pao, MD  There were no vitals filed for this visit.   [facility] Weekly seesion - 02/12/21 1300       [facility] "PREP" Location   [facility] "PREP" Location [facility]      Weekly Session   Topic Discussed Goal setting and welcome to the program   tour of facility, 20 minute workout/introduction to cardio machine   Classes attended to date [place] 02/12/2021, 1:09 PM

## 2021-02-14 ENCOUNTER — Other Ambulatory Visit: Payer: Self-pay

## 2021-02-14 ENCOUNTER — Encounter (INDEPENDENT_AMBULATORY_CARE_PROVIDER_SITE_OTHER): Payer: Medicare Other | Admitting: Ophthalmology

## 2021-02-14 DIAGNOSIS — H33301 Unspecified retinal break, right eye: Secondary | ICD-10-CM | POA: Diagnosis not present

## 2021-02-14 DIAGNOSIS — H338 Other retinal detachments: Secondary | ICD-10-CM

## 2021-02-14 DIAGNOSIS — I10 Essential (primary) hypertension: Secondary | ICD-10-CM

## 2021-02-14 DIAGNOSIS — H35031 Hypertensive retinopathy, right eye: Secondary | ICD-10-CM

## 2021-02-14 DIAGNOSIS — H43811 Vitreous degeneration, right eye: Secondary | ICD-10-CM | POA: Diagnosis not present

## 2021-02-14 DIAGNOSIS — H34811 Central retinal vein occlusion, right eye, with macular edema: Secondary | ICD-10-CM | POA: Diagnosis not present

## 2021-02-20 ENCOUNTER — Encounter: Payer: Self-pay | Admitting: Cardiovascular Disease

## 2021-02-21 DIAGNOSIS — U071 COVID-19: Secondary | ICD-10-CM | POA: Diagnosis not present

## 2021-03-19 ENCOUNTER — Encounter: Payer: Self-pay | Admitting: Cardiovascular Disease

## 2021-03-19 NOTE — Progress Notes (Signed)
[facility] PREP Weekly Session  Patient Details  Name: Anthony Brennan MRN: [id number] Date of Birth: 03-03-1951 Age: 71 y.o. PCP: Haywood Pao, MD  Vitals:   03/19/21 1508  Weight: 165 lb (74.8 kg)     [facility] Weekly seesion - 03/19/21 1500       [facility] "PREP" Location   [facility] "PREP" Location [facility]      Weekly Session   Topic Discussed Restaurant Eating   Salt demo; limit sodium intake to 1500-2300 mg. daily   Minutes exercised this week 20 minutes    Classes attended to date [place] 03/19/2021, 3:10 PM

## 2021-03-26 NOTE — Progress Notes (Signed)
[facility] PREP Weekly Session  Patient Details  Name: Anthony Brennan MRN: [id number] Date of Birth: December 12, 1950 Age: 71 y.o. PCP: Haywood Pao, MD  Vitals:   03/26/21 1314  Weight: 162 lb (73.5 kg)     [facility] Weekly seesion - 03/26/21 1300       [facility] "PREP" Location   [facility] "PREP" Location [facility]      Weekly Session   Topic Discussed Stress management and problem solving   fingertip/breath work Advertising account planner; fruit bowl guided meditation   Minutes exercised this week 20 minutes    Classes attended to date [place] 03/26/2021, 1:15 PM

## 2021-03-28 ENCOUNTER — Encounter (INDEPENDENT_AMBULATORY_CARE_PROVIDER_SITE_OTHER): Payer: Medicare Other | Admitting: Ophthalmology

## 2021-03-28 ENCOUNTER — Other Ambulatory Visit: Payer: Self-pay

## 2021-03-28 DIAGNOSIS — I10 Essential (primary) hypertension: Secondary | ICD-10-CM | POA: Diagnosis not present

## 2021-03-28 DIAGNOSIS — H34811 Central retinal vein occlusion, right eye, with macular edema: Secondary | ICD-10-CM

## 2021-03-28 DIAGNOSIS — H33301 Unspecified retinal break, right eye: Secondary | ICD-10-CM

## 2021-03-28 DIAGNOSIS — H35031 Hypertensive retinopathy, right eye: Secondary | ICD-10-CM | POA: Diagnosis not present

## 2021-03-28 DIAGNOSIS — H43811 Vitreous degeneration, right eye: Secondary | ICD-10-CM

## 2021-03-28 DIAGNOSIS — H338 Other retinal detachments: Secondary | ICD-10-CM

## 2021-04-05 ENCOUNTER — Encounter: Payer: Self-pay | Admitting: Cardiovascular Disease

## 2021-04-05 ENCOUNTER — Other Ambulatory Visit: Payer: Self-pay

## 2021-04-05 ENCOUNTER — Ambulatory Visit (INDEPENDENT_AMBULATORY_CARE_PROVIDER_SITE_OTHER): Payer: Medicare Other | Admitting: Cardiovascular Disease

## 2021-04-05 VITALS — BP 140/76 | HR 78 | Ht 67.0 in | Wt 164.0 lb

## 2021-04-05 DIAGNOSIS — E782 Mixed hyperlipidemia: Secondary | ICD-10-CM | POA: Diagnosis not present

## 2021-04-05 DIAGNOSIS — I10 Essential (primary) hypertension: Secondary | ICD-10-CM | POA: Diagnosis not present

## 2021-04-05 LAB — LIPID PANEL
Chol/HDL Ratio: 2.6 ratio (ref 0.0–5.0)
Cholesterol, Total: 146 mg/dL (ref 100–199)
HDL: 57 mg/dL (ref >39)
LDL Chol Calc (NIH): 73 mg/dL (ref 0–99)
Triglycerides: 84 mg/dL (ref 0–149)
VLDL Cholesterol Cal: 16 mg/dL (ref 5–40)

## 2021-04-05 LAB — BASIC METABOLIC PANEL
BUN/Creatinine Ratio: 19 (ref 10–24)
BUN: 19 mg/dL (ref 8–27)
CO2: 24 mmol/L (ref 20–29)
Calcium: 9.3 mg/dL (ref 8.6–10.2)
Chloride: 104 mmol/L (ref 96–106)
Creatinine, Ser: 1.01 mg/dL (ref 0.76–1.27)
Glucose: 112 mg/dL — ABNORMAL HIGH (ref 70–99)
Potassium: 4.8 mmol/L (ref 3.5–5.2)
Sodium: 141 mmol/L (ref 134–144)
eGFR: 81 mL/min/{1.73_m2} (ref >59)

## 2021-04-05 LAB — ALT: ALT: 15 IU/L (ref 0–44)

## 2021-04-05 NOTE — Patient Instructions (Signed)
Medication Instructions:  Your physician recommends that you continue on your current medications as directed. Please refer to the Current Medication list given to you today.  *If you need a refill on your cardiac medications before your next appointment, please call your pharmacy*   Lab Work: TODAY:  LIPID, BMP, & ALT  If you have labs (blood work) drawn today and your tests are completely normal, you will receive your results only by: [place] (if you have MyChart) OR A paper copy in the mail If you have any lab test that is abnormal or we need to change your treatment, we will call you to review the results.   Testing/Procedures: None ordered   Follow-Up: At [facility], you and your health needs are our priority.  As part of our continuing mission to provide you with exceptional heart care, we have created designated Provider Care Teams.  These Care Teams include your primary Cardiologist (physician) and Advanced Practice Providers (APPs -  Physician Assistants and Nurse Practitioners) who all work together to provide you with the care you need, when you need it.  We recommend signing up for the patient portal called "MyChart".  Sign up information is provided on this After Visit Summary.  MyChart is used to connect with patients for Virtual Visits (Telemedicine).  Patients are able to view lab/test results, encounter notes, upcoming appointments, etc.  Non-urgent messages can be sent to your provider as well.   To learn more about what you can do with MyChart, go to [email or web address].    Your next appointment:   12 month(s)  The format for your next appointment:   In Person  Provider:   Mertie Moores, MD  or Robbie Lis, PA-C, Christen Bame, NP, or Richardson Dopp, PA-C         Other Instructions

## 2021-04-05 NOTE — Progress Notes (Signed)
Cardiology Office Note   Date:  04/05/2021   ID:  Anthony Brennan, DOB 20-Feb-1951, MRN [id number]  PCP:  Haywood Pao, MD  Cardiologist:   Mertie Moores, MD   Chief Complaint  Patient presents with   Hyperlipidemia   Hypertension    Problem List:   1. HTN 2. Hyperlipidemia 3. DM     Anthony Brennan is a middle age gentleman with a hx of HTN and hyperlipidemia, DM.  He has done well .  June 02, 2012:  Anthony Brennan is doing well.  No CP or dyspnea.  Several dizzy spells yesterday - one episode occurred while he was sitting at his desk .  He has just started walking again this spring.     Jul 27, 2014 : Burnis Halling is a 71 y.o. male who presents for his HTN. Walking regularly.  No CP or dyspnea   Jul 19, 2015:  Doing well.    BP is regular at home .  August 14, 2017:  Anthony Brennan is seen back today for follow-up visit.  He has a history of hypertension and hyperlipidemia.  He was last seen 2 years ago. Has had some bruising  Exercising some.    Still working in the [employer].   Had labs drawn at [facility]. Glucose was mildly elevated at 112.  Creatinine is normal at 0.8.  Sodium and potassium are normal.  Liver enzymes are normal.  White blood cell count is normal.  Apoprotein B is normal.  PSA was 1.634.  HDL is 51.  LDL 76.  Total cholesterol is 145.  Triglyceride level is 88.  August 04, 2019:  Anthony Brennan is seen today for follow-up visit for his hypertension and hyperlipidemia.  He was seen approximately 2 years ago. He brought labs with him from his primary MD  CHol is 136 Trigs = 91 HDL = 46 LDL = 72  Doing well No cp.  No dyspnea.   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   BP is a bit elevated here today .  BP is usually 120's   jUne, 22, 2022  Anthony Brennan is seen today for follow up of his HTN, HLD  Had some chest pain while at the beach last year Was eating ( too fast he said)  Severe CP , pressure  Took several deep breaths,  drank water  He thought some  food was stuck in his esophagus.  Anthony Brennan ( wife)  asked him to go to the hospital - he did not  Did see GI ( Buccini)  Barium swallow showed  small hiatal hernia  Is scheduled for EGD .  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   Labs from his primary medical doctor in April, 2022 reveals a total cholesterol of 168 Triglyceride level is 78 HDL is 70 LDL is 82.  Nov. 2, 2022; Anthony Brennan is seen today for follow up visit Anthony Brennan was hospitalized on Aug. 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  Will have him see the lipid clinic for consideration of additionad therapy ( zetia vs PCSK 9 , vs Inclisaran )   His primary MD changed his Altace to Olmesartan 40 QD   Still eats bacon and sausage - encouraged him to avoid these .  Still gets some exercise  Encouraged him to walk more   Feb. 2, 2023 Anthony Brennan is seen today for follow up of his HTN and HLD  We started HCTZ 25 a day Took for about a month.  His BP looked good initially but he then developed presyncope.   Even with the HCTZ 12.5 daily he had difficulities.  His BP became too low.  He stopped the  HCTZ and felt much better  BP is typically in the 120-130 range.   No cp , no dyspnea, no further episodes of dizziness Going to the PREP classes at [facility]  Is enjoying the classes    Past Medical History:  Diagnosis Date   Chest pain    Recent episode of - stress Myoview study was normal   Diabetes mellitus    Hyperlipidemia    Hypertension    Hypertensive retinopathy    OU    Past Surgical History:  Procedure Laterality Date   APPENDECTOMY   1970   CARDIOVASCULAR STRESS TEST  02-21-2010   EF 65%   CATARACT EXTRACTION Bilateral    EYE SURGERY     HERNIA REPAIR  71 years old   RETINAL DETACHMENT SURGERY Left    S/P SB, PPV     Current Outpatient Medications  Medication Sig Dispense Refill   acetaminophen (TYLENOL) 325 MG tablet Take 650 mg by mouth every 6 (six) hours as needed for moderate pain.     albuterol (VENTOLIN HFA) 108 (90 Base) MCG/ACT inhaler Inhale 1-2 puffs into the lungs every 4 (four) hours as needed for shortness of breath or wheezing.     aspirin 81 MG tablet Take 81 mg by mouth daily.       Cholecalciferol (VITAMIN D PO) Take 1 capsule by mouth daily.      Dulaglutide 0.75 MG/0.5ML SOPN Inject 0.75 mg into the skin every Monday.     empagliflozin (JARDIANCE) 25 MG TABS tablet Take 25 mg by mouth daily.     famotidine (PEPCID) 40 MG tablet Take 40 mg by mouth daily as needed.     metFORMIN (GLUCOPHAGE-XR) 500 MG 24 hr tablet Take 2 tablets (1,000 mg total) by mouth 2 (two) times daily.     olmesartan (BENICAR) 40 MG tablet Take 40 mg by mouth daily.     pioglitazone (ACTOS) 30 MG tablet Take 1 tablet by mouth daily.     Polyethyl Glycol-Propyl Glycol 0.4-0.3 % SOLN Place 1 drop into the left eye as needed.     rosuvastatin (CRESTOR) 40 MG tablet Take 40 mg by mouth at bedtime.  3   valACYclovir (VALTREX) 1000 MG tablet Take 2,000 mg by mouth as needed (fever blister).     No current facility-administered medications for this visit.    Allergies:   Other    Social History:  The patient  reports that he has never smoked. He has never used smokeless tobacco. He reports current alcohol use.   Family History:  The patient's family history includes Diabetes in his father, paternal uncle, and another family member; Hyperlipidemia in his brother; Hypertension in his mother.    ROS:    Reviewed and current history, otherwise review of  systems is negative.  Physical Exam: Blood pressure 140/76, pulse 78,  height 5\' 7"  (1.702 m), weight 164 lb (74.4 kg), SpO2 99 %.  GEN:  Well nourished, well developed in no acute distress HEENT: Normal NECK: No JVD; No carotid bruits LYMPHATICS: No lymphadenopathy CARDIAC: RRR , no murmurs, rubs, gallops RESPIRATORY:  Clear to auscultation without rales, wheezing or rhonchi  ABDOMEN: Soft, non-tender, non-distended MUSCULOSKELETAL:  No edema; No deformity  SKIN: Warm and dry NEUROLOGIC:  Alert and oriented x 3   EKG:        Recent Labs: 10/23/2020: ALT 18 10/24/2020: Hemoglobin 15.0; Platelets 233 01/19/2021: BUN 27; Creatinine, Ser 1.03; Potassium 4.4; Sodium 140    Lipid Panel    Component Value Date/Time   CHOL 180 10/24/2020 0730   TRIG 137 10/24/2020 0730   HDL 64 10/24/2020 0730   CHOLHDL 2.8 10/24/2020 0730   VLDL 27 10/24/2020 0730   LDLCALC 89 10/24/2020 0730      Wt Readings from Last 3 Encounters:  04/05/21 164 lb (74.4 kg)  03/26/21 162 lb (73.5 kg)  03/19/21 165 lb (74.8 kg)      Other studies Reviewed: Additional studies/ records that were reviewed today include: . Review of the above records demonstrates:    ASSESSMENT AND PLAN:   Chest pain :  no further episodes of CP    2. Hyperlipidemia - lipids will be checked today  Cont rosuvastatin  His diet is improving   4.   HTN-     he did not tolerate the HCTZ.   Even the 12.5 mg dose caused presyncope and dizziness.   BP is generally well controlled.   Cont with current meds for now.    Current medicines are reviewed at length with the patient today.  The patient does not have concerns regarding medicines.  The following changes have been made:  no change  Labs/ tests ordered today include:   Orders Placed This Encounter  Procedures   Lipid panel   Basic Metabolic Panel (BMET)   ALT    Follow up in 1 year with me or APP     Mertie Moores, MD  04/05/2021 [address], [place], [place]  [address] Phone:  [phone]; Fax: [phone]

## 2021-04-09 NOTE — Progress Notes (Signed)
[facility] PREP Weekly Session  Patient Details  Name: Anthony Brennan MRN: [id number] Date of Birth: [birth year] Age: 71 y.o. PCP: Haywood Pao, MD  Vitals:   04/09/21 1238  Weight: 162 lb (73.5 kg)     [facility] Weekly seesion - 04/09/21 1200       [facility] "PREP" Location   [facility] "PREP" Location [facility]      Weekly Session   Topic Discussed Other   Portion size matters; visualize your portion size demo; review of food labels   Minutes exercised this week 40 minutes    Classes attended to date [place] 04/09/2021, 12:41 PM

## 2021-04-16 NOTE — Progress Notes (Signed)
[facility] PREP Weekly Session  Patient Details  Name: Anthony Brennan MRN: [id number] Date of Birth: [birth year] Age: 71 y.o. PCP: Haywood Pao, MD  Vitals:   04/16/21 1301  Weight: 160 lb (72.6 kg)     [facility] Weekly seesion - 04/16/21 1300       [facility] "PREP" Location   [facility] "PREP" Location [facility]      Weekly Session   Topic Discussed Finding support   Review of several food labels, YUKA app   Minutes exercised this week 30 minutes    Classes attended to date [place] 04/16/2021, 1:02 PM

## 2021-04-30 NOTE — Progress Notes (Signed)
[facility] PREP Weekly Session  Patient Details  Name: Anthony Brennan MRN: [id number] Date of Birth: 1951/01/04 Age: 71 y.o. PCP: Haywood Pao, MD  Vitals:   04/30/21 1537  Weight: 164 lb (74.4 kg)     [facility] Weekly seesion - 04/30/21 1500       [facility] "PREP" Location   [facility] "PREP" Location [facility]      Weekly Session   Topic Discussed Hitting roadblocks   Reviewed goals and activity plan for next 90 days after PRPE classes end; to bring to final assessment visit next week.   Minutes exercised this week 60 minutes    Classes attended to date [place] 04/30/2021, 3:38 PM

## 2021-05-07 NOTE — Progress Notes (Signed)
[facility] PREP Weekly Session ? ?Patient Details  ?Name: Anthony Brennan ?MRN: [id number] ?Date of Birth: 1950-04-02 ?Age: 71 y.o. ?PCP: Haywood Pao, MD ? ?Vitals:  ? 05/07/21 1249  ?Weight: 161 lb (73 kg)  ? ? ? [facility] Weekly seesion - 05/07/21 1200   ? ?  ? [facility] "PREP" Location  ? [facility] "PREP[facility]   ?  ? Weekly Session  ? Topic Discussed Other   Fit testing completed, final assessment visit scheduled for Wednesday; to bring goals and acitivity plan and PREP survey to final visit.  ? Minutes exercised this week 40 minutes   ? Classes attended to date 48   ? ?  ?  ? ?  ? ? ?Zachery Dakins Cathryn Gallery ?05/07/2021, 12:50 PM ? ? ?

## 2021-05-09 NOTE — Progress Notes (Signed)
[facility] PREP Evaluation ? ?Patient Details  ?Name: Anthony Brennan ?MRN: [id number] ?Date of Birth: 25-Feb-1951 ?Age: 71 y.o. ?PCP: Haywood Pao, MD ? ?Vitals:  ? 05/09/21 0958  ?BP: (!) 112/50  ?Pulse: 83  ?SpO2: 99%  ?Weight: 165 lb 6.4 oz (75 kg)  ? ? ? [facility] Eval - 05/09/21 0900   ? ?  ? [facility] "PREP" Location  ? [facility]PREP[facility]   ?  ? Referral   ? Program Start Date 05/09/21   Program end date  ?  ? Measurement  ? Neck measurement 16 Inches   ? Waist Circumference 37 inches   ? Body fat 28 percent   ?  ? Mobility and Daily Activities  ? I find it easy to walk up or down two or more flights of stairs. 4   ? I have no trouble taking out the trash. 4   ? I do housework such as vacuuming and dusting on my own without difficulty. 4   ? I can easily lift a gallon of milk (8lbs). 4   ? I can easily walk a mile. 4   ? I have no trouble reaching into high cupboards or reaching down to pick up something from the floor. 3   ? I do not have trouble doing out-door work such as Armed forces logistics/support/administrative officer, raking leaves, or gardening. 4   ?  ? Mobility and Daily Activities  ? I feel younger than my age. 4   ? I feel independent. 4   ? I feel energetic. 4   ? I live an active life.  4   ? I feel strong. 4   ? I feel healthy. 3   ? I feel active as other people my age. 4   ?  ? How fit and strong are you.  ? Fit and Strong Total Score 54   ? ?  ?  ? ?  ? ?Past Medical History:  ?Diagnosis Date  ? Chest pain   ? Recent episode of - stress Myoview study was normal  ? Diabetes mellitus   ? Hyperlipidemia   ? Hypertension   ? Hypertensive retinopathy   ? OU  ? ?Past Surgical History:  ?Procedure Laterality Date  ? APPENDECTOMY  1970  ? CARDIOVASCULAR STRESS TEST  02-21-2010  ? EF 65%  ? CATARACT EXTRACTION Bilateral   ? EYE SURGERY    ? HERNIA REPAIR  71 years old  ? RETINAL DETACHMENT SURGERY Left   ? S/P SB, PPV  ? ?Social History  ? ?Tobacco Use  ?Smoking Status Never  ?Smokeless Tobacco Never  ?Final assessment visit: 4  lbs lost; how fit and strong survey 02/12/21: 51, 05/03/21: 54; BP 12/12: 122/74, 05/09/21: 112/50 ?Education sessions completed: 7 workouts completed: 8 ? ?[place] ?05/09/2021, 10:00 AM ? ? ?

## 2021-05-23 ENCOUNTER — Other Ambulatory Visit: Payer: Self-pay

## 2021-05-23 ENCOUNTER — Encounter (INDEPENDENT_AMBULATORY_CARE_PROVIDER_SITE_OTHER): Payer: Medicare Other | Admitting: Ophthalmology

## 2021-05-23 DIAGNOSIS — I10 Essential (primary) hypertension: Secondary | ICD-10-CM

## 2021-05-23 DIAGNOSIS — H33301 Unspecified retinal break, right eye: Secondary | ICD-10-CM | POA: Diagnosis not present

## 2021-05-23 DIAGNOSIS — H43811 Vitreous degeneration, right eye: Secondary | ICD-10-CM | POA: Diagnosis not present

## 2021-05-23 DIAGNOSIS — H35031 Hypertensive retinopathy, right eye: Secondary | ICD-10-CM

## 2021-05-23 DIAGNOSIS — H34811 Central retinal vein occlusion, right eye, with macular edema: Secondary | ICD-10-CM | POA: Diagnosis not present

## 2021-05-28 ENCOUNTER — Other Ambulatory Visit: Payer: Self-pay

## 2021-05-28 ENCOUNTER — Encounter (INDEPENDENT_AMBULATORY_CARE_PROVIDER_SITE_OTHER): Payer: Medicare Other | Admitting: Ophthalmology

## 2021-05-28 DIAGNOSIS — I10 Essential (primary) hypertension: Secondary | ICD-10-CM

## 2021-05-28 DIAGNOSIS — H348112 Central retinal vein occlusion, right eye, stable: Secondary | ICD-10-CM

## 2021-05-28 DIAGNOSIS — H35031 Hypertensive retinopathy, right eye: Secondary | ICD-10-CM

## 2021-05-28 DIAGNOSIS — H43811 Vitreous degeneration, right eye: Secondary | ICD-10-CM | POA: Diagnosis not present

## 2021-05-28 DIAGNOSIS — H33301 Unspecified retinal break, right eye: Secondary | ICD-10-CM

## 2021-06-20 ENCOUNTER — Encounter (INDEPENDENT_AMBULATORY_CARE_PROVIDER_SITE_OTHER): Payer: Medicare Other | Admitting: Ophthalmology

## 2021-06-20 DIAGNOSIS — H348112 Central retinal vein occlusion, right eye, stable: Secondary | ICD-10-CM | POA: Diagnosis not present

## 2021-06-20 DIAGNOSIS — I10 Essential (primary) hypertension: Secondary | ICD-10-CM

## 2021-06-20 DIAGNOSIS — H33301 Unspecified retinal break, right eye: Secondary | ICD-10-CM | POA: Diagnosis not present

## 2021-06-20 DIAGNOSIS — H35031 Hypertensive retinopathy, right eye: Secondary | ICD-10-CM | POA: Diagnosis not present

## 2021-06-20 DIAGNOSIS — H43811 Vitreous degeneration, right eye: Secondary | ICD-10-CM | POA: Diagnosis not present

## 2021-07-01 DIAGNOSIS — E7800 Pure hypercholesterolemia, unspecified: Secondary | ICD-10-CM | POA: Diagnosis not present

## 2021-07-01 DIAGNOSIS — I10 Essential (primary) hypertension: Secondary | ICD-10-CM | POA: Diagnosis not present

## 2021-07-01 DIAGNOSIS — E119 Type 2 diabetes mellitus without complications: Secondary | ICD-10-CM | POA: Diagnosis not present

## 2021-07-11 DIAGNOSIS — E559 Vitamin D deficiency, unspecified: Secondary | ICD-10-CM | POA: Diagnosis not present

## 2021-07-11 DIAGNOSIS — Z125 Encounter for screening for malignant neoplasm of prostate: Secondary | ICD-10-CM | POA: Diagnosis not present

## 2021-07-11 DIAGNOSIS — I10 Essential (primary) hypertension: Secondary | ICD-10-CM | POA: Diagnosis not present

## 2021-07-11 DIAGNOSIS — E7800 Pure hypercholesterolemia, unspecified: Secondary | ICD-10-CM | POA: Diagnosis not present

## 2021-07-11 DIAGNOSIS — E119 Type 2 diabetes mellitus without complications: Secondary | ICD-10-CM | POA: Diagnosis not present

## 2021-07-18 ENCOUNTER — Encounter (INDEPENDENT_AMBULATORY_CARE_PROVIDER_SITE_OTHER): Payer: Medicare Other | Admitting: Ophthalmology

## 2021-07-18 DIAGNOSIS — M19049 Primary osteoarthritis, unspecified hand: Secondary | ICD-10-CM | POA: Diagnosis not present

## 2021-07-18 DIAGNOSIS — H348112 Central retinal vein occlusion, right eye, stable: Secondary | ICD-10-CM | POA: Diagnosis not present

## 2021-07-18 DIAGNOSIS — H43811 Vitreous degeneration, right eye: Secondary | ICD-10-CM | POA: Diagnosis not present

## 2021-07-18 DIAGNOSIS — I10 Essential (primary) hypertension: Secondary | ICD-10-CM | POA: Diagnosis not present

## 2021-07-18 DIAGNOSIS — H33301 Unspecified retinal break, right eye: Secondary | ICD-10-CM | POA: Diagnosis not present

## 2021-07-18 DIAGNOSIS — H35031 Hypertensive retinopathy, right eye: Secondary | ICD-10-CM | POA: Diagnosis not present

## 2021-07-18 DIAGNOSIS — Z1339 Encounter for screening examination for other mental health and behavioral disorders: Secondary | ICD-10-CM | POA: Diagnosis not present

## 2021-07-18 DIAGNOSIS — Z0000 Encounter for general adult medical examination without abnormal findings: Secondary | ICD-10-CM | POA: Diagnosis not present

## 2021-07-18 DIAGNOSIS — E119 Type 2 diabetes mellitus without complications: Secondary | ICD-10-CM | POA: Diagnosis not present

## 2021-07-18 DIAGNOSIS — H35033 Hypertensive retinopathy, bilateral: Secondary | ICD-10-CM | POA: Diagnosis not present

## 2021-07-18 DIAGNOSIS — Z1331 Encounter for screening for depression: Secondary | ICD-10-CM | POA: Diagnosis not present

## 2021-07-18 DIAGNOSIS — G250 Essential tremor: Secondary | ICD-10-CM | POA: Diagnosis not present

## 2021-07-18 DIAGNOSIS — R82998 Other abnormal findings in urine: Secondary | ICD-10-CM | POA: Diagnosis not present

## 2021-07-18 DIAGNOSIS — E7800 Pure hypercholesterolemia, unspecified: Secondary | ICD-10-CM | POA: Diagnosis not present

## 2021-08-20 ENCOUNTER — Encounter (INDEPENDENT_AMBULATORY_CARE_PROVIDER_SITE_OTHER): Payer: Medicare Other | Admitting: Ophthalmology

## 2021-08-20 DIAGNOSIS — H33301 Unspecified retinal break, right eye: Secondary | ICD-10-CM

## 2021-08-20 DIAGNOSIS — H348112 Central retinal vein occlusion, right eye, stable: Secondary | ICD-10-CM

## 2021-08-20 DIAGNOSIS — I10 Essential (primary) hypertension: Secondary | ICD-10-CM | POA: Diagnosis not present

## 2021-08-20 DIAGNOSIS — H43811 Vitreous degeneration, right eye: Secondary | ICD-10-CM

## 2021-08-20 DIAGNOSIS — H35031 Hypertensive retinopathy, right eye: Secondary | ICD-10-CM

## 2021-09-19 ENCOUNTER — Encounter (INDEPENDENT_AMBULATORY_CARE_PROVIDER_SITE_OTHER): Payer: Medicare Other | Admitting: Ophthalmology

## 2021-09-19 DIAGNOSIS — H33301 Unspecified retinal break, right eye: Secondary | ICD-10-CM | POA: Diagnosis not present

## 2021-09-19 DIAGNOSIS — I10 Essential (primary) hypertension: Secondary | ICD-10-CM | POA: Diagnosis not present

## 2021-09-19 DIAGNOSIS — H35031 Hypertensive retinopathy, right eye: Secondary | ICD-10-CM | POA: Diagnosis not present

## 2021-09-19 DIAGNOSIS — H348112 Central retinal vein occlusion, right eye, stable: Secondary | ICD-10-CM

## 2021-09-19 DIAGNOSIS — H43811 Vitreous degeneration, right eye: Secondary | ICD-10-CM

## 2021-09-25 ENCOUNTER — Encounter (INDEPENDENT_AMBULATORY_CARE_PROVIDER_SITE_OTHER): Payer: Medicare Other | Admitting: Ophthalmology

## 2021-10-02 IMAGING — RF DG ESOPHAGUS
5 series · 15 of 15 positions shown · non-contrast
Comparison: None.

CLINICAL DATA: Regurgitation of food

EXAM:
ESOPHOGRAM / BARIUM SWALLOW / BARIUM TABLET STUDY
TECHNIQUE: Combined double contrast and single contrast examination performed
using effervescent crystals, thick barium liquid, and thin barium
liquid. The patient was observed with fluoroscopy swallowing a 13 mm
barium sulphate tablet.
FLUOROSCOPY TIME:  Fluoroscopy Time:  1 minutes 54 second
Radiation Exposure Index (if provided by the fluoroscopic device):
Number of Acquired Spot Images: 5

[Series 1: sequence · 0.28mm/px · 4 of 30 frames shown (1 of 3)]
[frame 5/30]
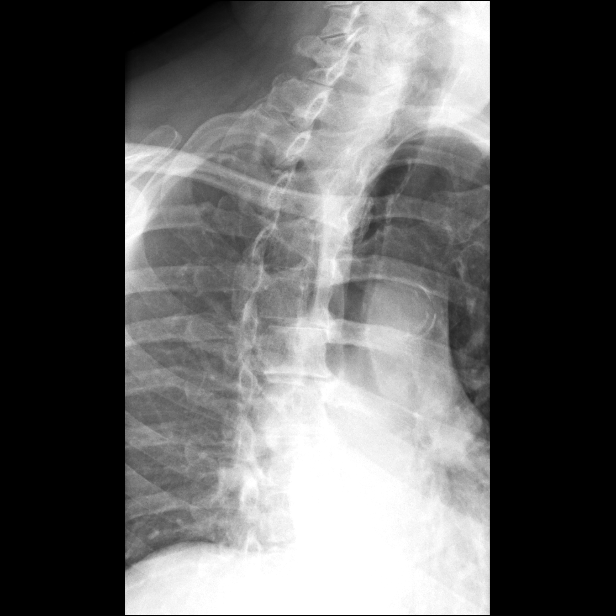
[frame 16/30]
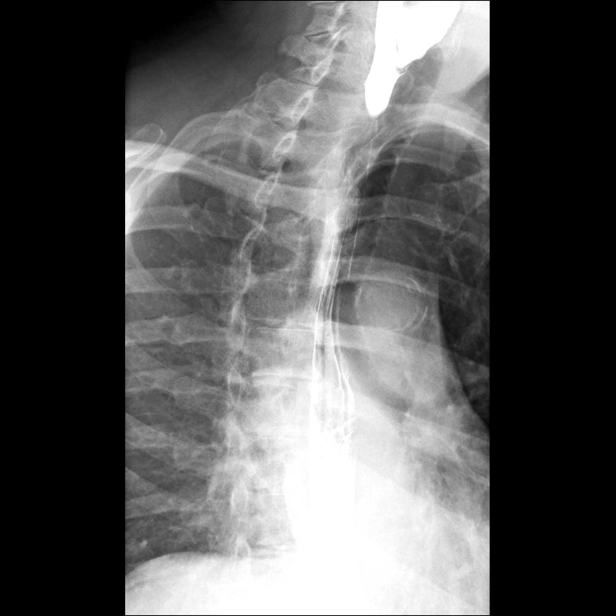
[frame 26/30]
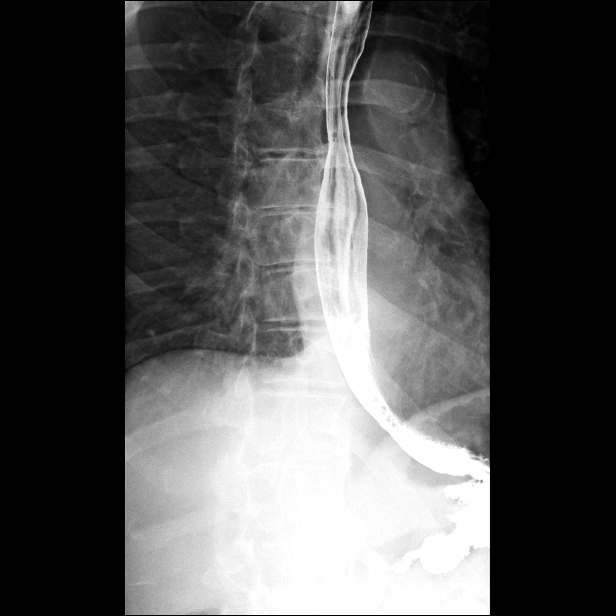
[frame 30/30]
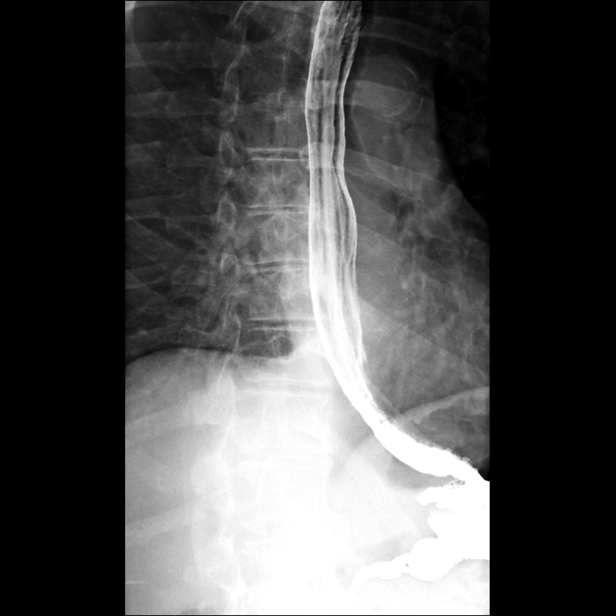

[Series 2: one shot · 1 of 1 slices shown (1 of 2)]
[im 1/1]
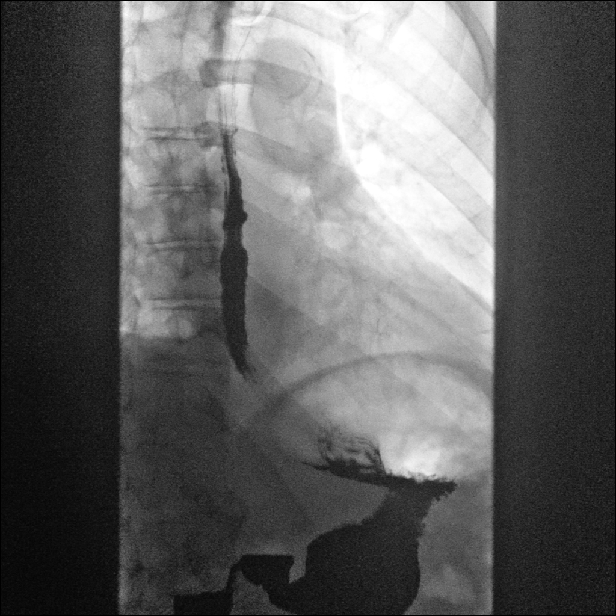

[Series 3: sequence · 3 of 36 frames shown (2 of 3)]
[frame 6/36]
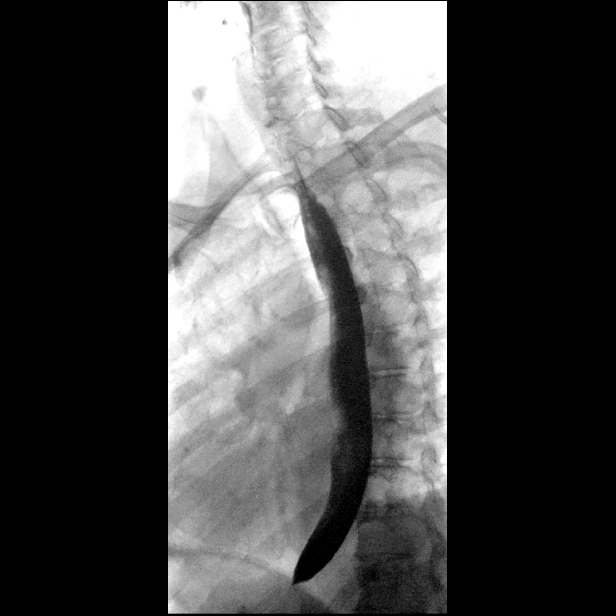
[frame 19/36]
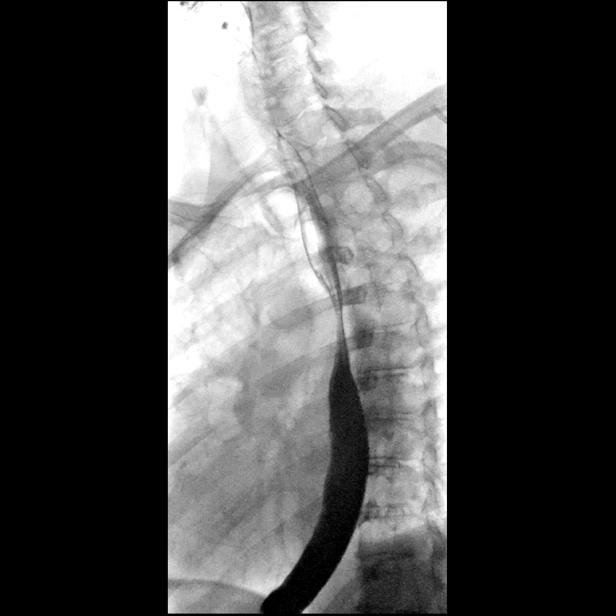
[frame 31/36]
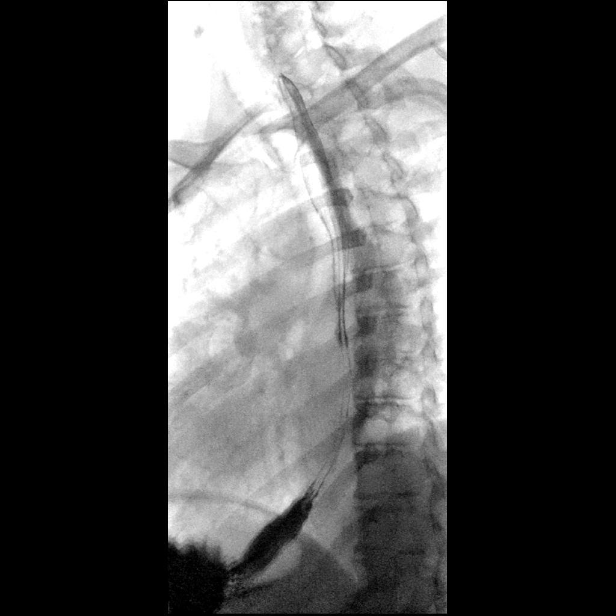

[Series 4: sequence · 4 of 82 frames shown (3 of 3)]
[frame 13/82]
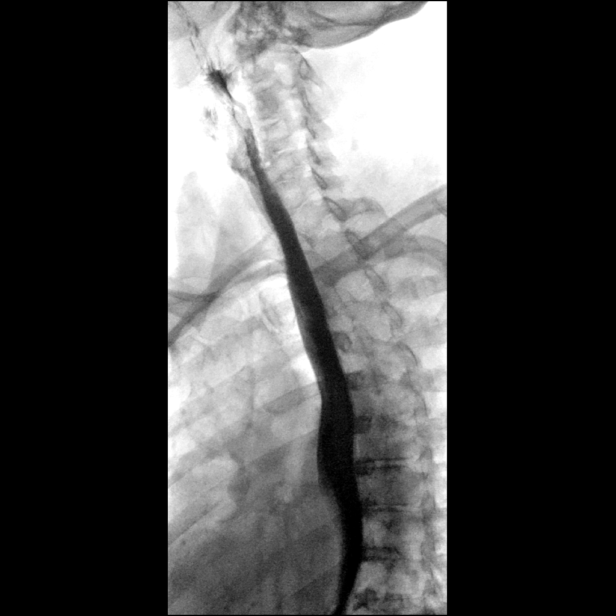
[frame 32/82]
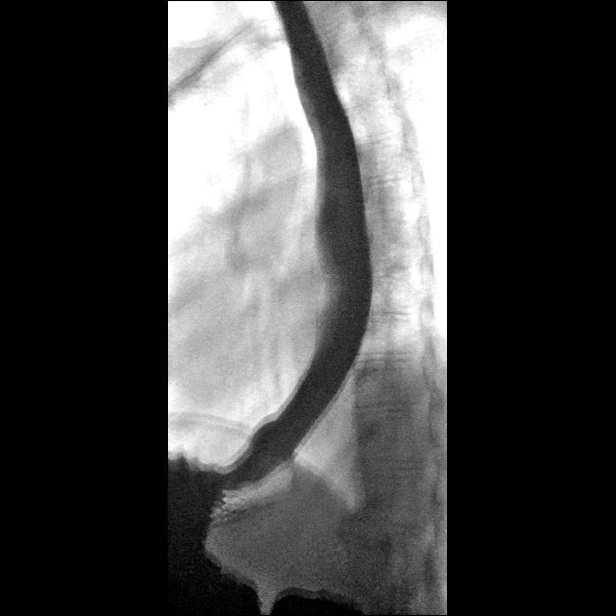
[frame 42/82]
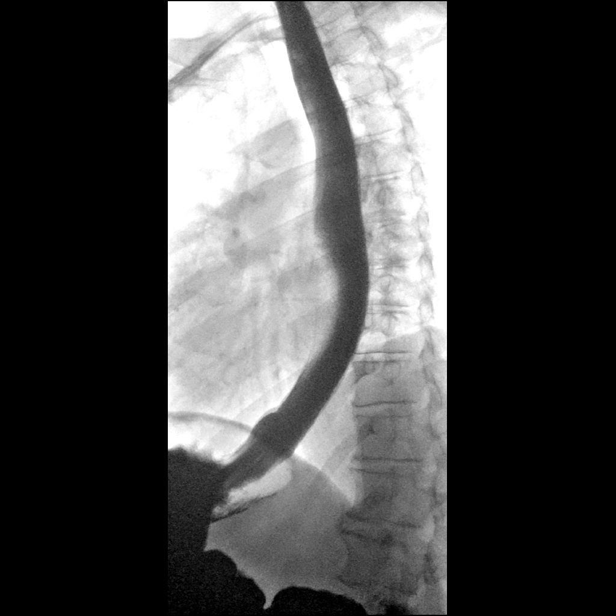
[frame 70/82]
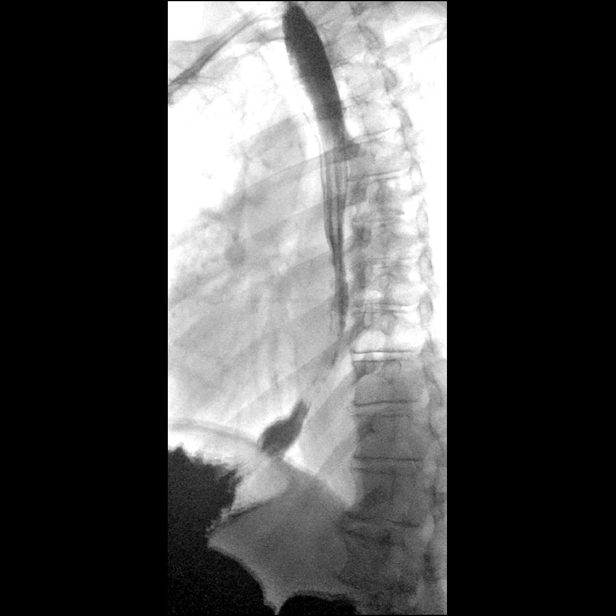

[Series 5: one shot · 3 of 3 slices shown (2 of 2)]
[im 1/3]
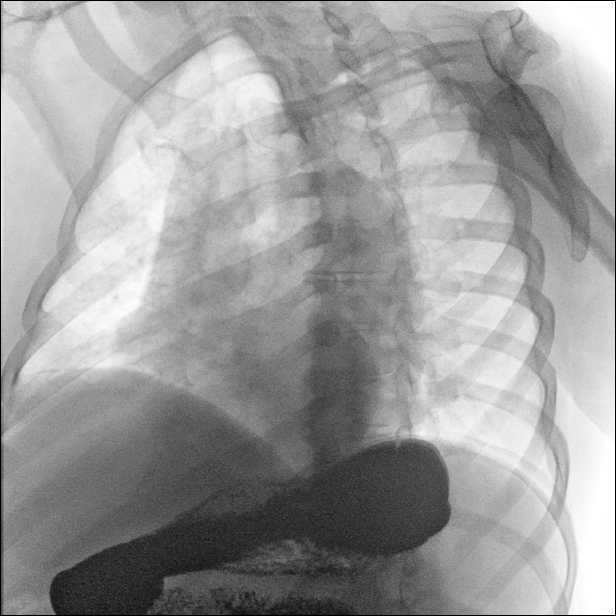
[im 2/3]
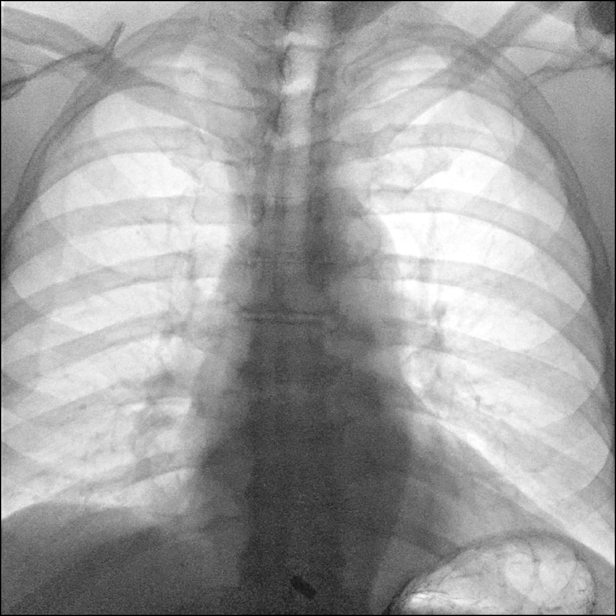
[im 3/3]
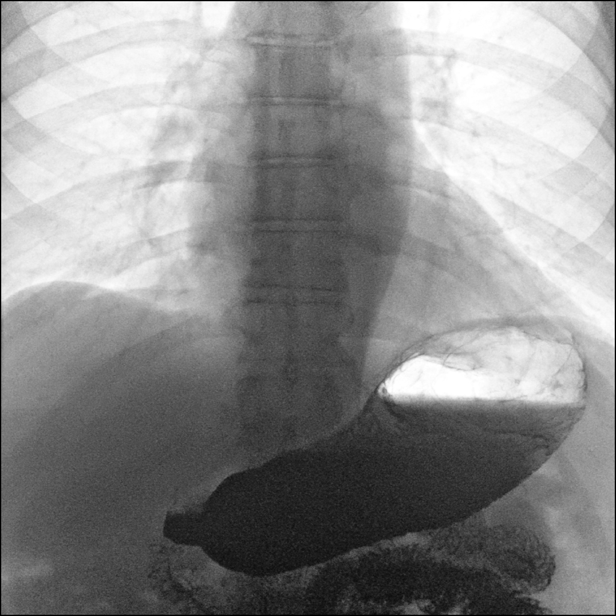

[15 of 15 positions shown; findings below may reference images not displayed]

FINDINGS: Mild decreased esophageal motility. No esophageal stricture or mass.
Small hiatal hernia without reflux. Barium tablet passed readily
into the stomach without delay.
IMPRESSION: Mild esophageal dysmotility.

Small hiatal hernia without reflux.

## 2021-10-31 ENCOUNTER — Encounter (INDEPENDENT_AMBULATORY_CARE_PROVIDER_SITE_OTHER): Payer: Medicare Other | Admitting: Ophthalmology

## 2021-10-31 DIAGNOSIS — H43811 Vitreous degeneration, right eye: Secondary | ICD-10-CM | POA: Diagnosis not present

## 2021-10-31 DIAGNOSIS — H35031 Hypertensive retinopathy, right eye: Secondary | ICD-10-CM

## 2021-10-31 DIAGNOSIS — H33301 Unspecified retinal break, right eye: Secondary | ICD-10-CM

## 2021-10-31 DIAGNOSIS — I10 Essential (primary) hypertension: Secondary | ICD-10-CM

## 2021-10-31 DIAGNOSIS — H348112 Central retinal vein occlusion, right eye, stable: Secondary | ICD-10-CM | POA: Diagnosis not present

## 2021-12-11 DIAGNOSIS — Z23 Encounter for immunization: Secondary | ICD-10-CM | POA: Diagnosis not present

## 2021-12-26 ENCOUNTER — Encounter (INDEPENDENT_AMBULATORY_CARE_PROVIDER_SITE_OTHER): Payer: Medicare Other | Admitting: Ophthalmology

## 2021-12-26 DIAGNOSIS — H348112 Central retinal vein occlusion, right eye, stable: Secondary | ICD-10-CM

## 2021-12-26 DIAGNOSIS — D3131 Benign neoplasm of right choroid: Secondary | ICD-10-CM | POA: Diagnosis not present

## 2021-12-26 DIAGNOSIS — H33301 Unspecified retinal break, right eye: Secondary | ICD-10-CM

## 2021-12-26 DIAGNOSIS — H35031 Hypertensive retinopathy, right eye: Secondary | ICD-10-CM

## 2021-12-26 DIAGNOSIS — I10 Essential (primary) hypertension: Secondary | ICD-10-CM | POA: Diagnosis not present

## 2021-12-26 DIAGNOSIS — H43811 Vitreous degeneration, right eye: Secondary | ICD-10-CM | POA: Diagnosis not present

## 2022-02-06 DIAGNOSIS — H348112 Central retinal vein occlusion, right eye, stable: Secondary | ICD-10-CM | POA: Diagnosis not present

## 2022-02-06 DIAGNOSIS — M19049 Primary osteoarthritis, unspecified hand: Secondary | ICD-10-CM | POA: Diagnosis not present

## 2022-02-06 DIAGNOSIS — I10 Essential (primary) hypertension: Secondary | ICD-10-CM | POA: Diagnosis not present

## 2022-02-06 DIAGNOSIS — E559 Vitamin D deficiency, unspecified: Secondary | ICD-10-CM | POA: Diagnosis not present

## 2022-02-06 DIAGNOSIS — H35033 Hypertensive retinopathy, bilateral: Secondary | ICD-10-CM | POA: Diagnosis not present

## 2022-02-06 DIAGNOSIS — E119 Type 2 diabetes mellitus without complications: Secondary | ICD-10-CM | POA: Diagnosis not present

## 2022-02-06 DIAGNOSIS — Z23 Encounter for immunization: Secondary | ICD-10-CM | POA: Diagnosis not present

## 2022-02-06 DIAGNOSIS — G250 Essential tremor: Secondary | ICD-10-CM | POA: Diagnosis not present

## 2022-02-06 DIAGNOSIS — E7800 Pure hypercholesterolemia, unspecified: Secondary | ICD-10-CM | POA: Diagnosis not present

## 2022-03-27 ENCOUNTER — Encounter (INDEPENDENT_AMBULATORY_CARE_PROVIDER_SITE_OTHER): Payer: Medicare Other | Admitting: Ophthalmology

## 2022-03-27 DIAGNOSIS — H35031 Hypertensive retinopathy, right eye: Secondary | ICD-10-CM

## 2022-03-27 DIAGNOSIS — E113391 Type 2 diabetes mellitus with moderate nonproliferative diabetic retinopathy without macular edema, right eye: Secondary | ICD-10-CM | POA: Diagnosis not present

## 2022-03-27 DIAGNOSIS — H43811 Vitreous degeneration, right eye: Secondary | ICD-10-CM | POA: Diagnosis not present

## 2022-03-27 DIAGNOSIS — D3131 Benign neoplasm of right choroid: Secondary | ICD-10-CM | POA: Diagnosis not present

## 2022-03-27 DIAGNOSIS — I10 Essential (primary) hypertension: Secondary | ICD-10-CM

## 2022-03-27 DIAGNOSIS — H348112 Central retinal vein occlusion, right eye, stable: Secondary | ICD-10-CM | POA: Diagnosis not present

## 2022-04-09 ENCOUNTER — Encounter: Payer: Self-pay | Admitting: Cardiovascular Disease

## 2022-04-09 NOTE — Progress Notes (Unsigned)
Cardiology Office Note   Date:  04/10/2022   ID:  [name], DOB 1950/04/15, MRN [id number]  PCP:  Haywood Pao, MD  Cardiologist:   Mertie Moores, MD   Chief Complaint  Patient presents with   Hyperlipidemia    Problem List:   1. HTN 2. Hyperlipidemia 3. DM     Anthony Brennan is a middle age gentleman with a hx of HTN and hyperlipidemia, DM.  He has done well .  June 02, 2012:  Anthony Brennan is doing well.  No CP or dyspnea.  Several dizzy spells yesterday - one episode occurred while he was sitting at his desk .  He has just started walking again this spring.     Jul 27, 2014 : Anthony Brennan is a 72 y.o. male who presents for his HTN. Walking regularly.  No CP or dyspnea   Jul 19, 2015:  Doing well.    BP is regular at home .  August 14, 2017:  Anthony Brennan is seen back today for follow-up visit.  He has a history of hypertension and hyperlipidemia.  He was last seen 2 years ago. Has had some bruising  Exercising some.    Still working in the [employer].   Had labs drawn at [facility]. Glucose was mildly elevated at 112.  Creatinine is normal at 0.8.  Sodium and potassium are normal.  Liver enzymes are normal.  White blood cell count is normal.  Apoprotein B is normal.  PSA was 1.634.  HDL is 51.  LDL 76.  Total cholesterol is 145.  Triglyceride level is 88.  August 04, 2019:  Anthony Brennan is seen today for follow-up visit for his hypertension and hyperlipidemia.  He was seen approximately 2 years ago. He brought labs with him from his primary MD  CHol is 136 Trigs = 91 HDL = 46 LDL = 72  Doing well No cp.  No dyspnea.   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   BP is a bit elevated here today .  BP is usually 120's   jUne, 22, 2022  Anthony Brennan is seen today for follow up of his HTN, HLD  Had some chest pain while at the beach last year Was eating ( too fast he said)  Severe CP , pressure  Took several deep breaths,  drank water  He thought some food was stuck in  his esophagus.  [name] ( wife)  asked him to go to the hospital - he did not  Did see GI ( Buccini)  Barium swallow showed  small hiatal hernia  Is scheduled for EGD .  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   Labs from his primary medical doctor in April, 2022 reveals a total cholesterol of 168 Triglyceride level is 78 HDL is 70 LDL is 82.  Nov. 2, 2022; Anthony Brennan is seen today for follow up visit Anthony Brennan was hospitalized on Aug. 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  Will have him see the lipid clinic for consideration of additionad therapy ( zetia vs PCSK 9 , vs Inclisaran )   His primary MD changed his Altace to Olmesartan 40 QD   Still eats bacon and sausage - encouraged him to avoid these .  Still gets some exercise  Encouraged him to walk more   Feb. 2, 2023 Anthony Brennan is seen today for follow up of his HTN and HLD  We started HCTZ 25 a day Took for about a month.  His BP looked good initially but he then developed presyncope.   Even with the HCTZ 12.5 daily he had difficulities.  His BP became too low.  He stopped the  HCTZ and felt much better  BP is typically in the 120-130 range.   No cp , no dyspnea, no further episodes of dizziness Going to the PREP classes at [facility]  Is enjoying the classes    Feb. 7, 2024 Anthony Brennan is seen today for follow up of his HTN, HLD Enjoying retirement  Staying active.   Anthony Brennan out to his farm [place]   Labs from his primary medical doctor from May, 2023 reveal a total cholesterol of 169 Triglyceride level is 99 HDL is 55 LDL is 94 On rosuvastatin  40 mg   We discussed coronary calcium scoring and LP(a) testing  He is not inclined to do that at this time .   Father died at age 28s         Past Medical History:  Diagnosis Date   Chest pain    Recent episode of - stress Myoview study was normal   Diabetes mellitus    Hyperlipidemia    Hypertension    Hypertensive retinopathy    OU    Past Surgical History:  Procedure Laterality Date   APPENDECTOMY  1970   CARDIOVASCULAR STRESS TEST  02-21-2010   EF 65%   CATARACT EXTRACTION Bilateral    EYE SURGERY     HERNIA REPAIR  72 years old   RETINAL DETACHMENT SURGERY Left    S/P SB, PPV     Current Outpatient Medications  Medication Sig Dispense Refill   acetaminophen (TYLENOL) 325 MG tablet Take 650 mg by mouth every 6 (six) hours as needed for moderate pain.     albuterol (VENTOLIN HFA) 108 (90 Base) MCG/ACT inhaler Inhale 1-2 puffs into the lungs every 4 (four) hours as needed for shortness of breath or wheezing.     aspirin 81 MG tablet Take 81 mg by mouth daily.       Cholecalciferol (VITAMIN D PO) Take 1 capsule by mouth daily.      Dulaglutide 0.75 MG/0.5ML SOPN Inject 0.75 mg into the skin every Monday.     empagliflozin (JARDIANCE) 25 MG TABS tablet Take 25 mg by mouth daily.     famotidine (PEPCID) 40 MG tablet Take 40 mg by mouth daily as needed.     metFORMIN (GLUCOPHAGE-XR) 500 MG 24 hr tablet Take 2 tablets (1,000 mg total) by mouth 2 (two) times daily.     olmesartan (BENICAR) 40 MG tablet Take 40 mg by mouth daily.     pioglitazone (ACTOS) 30 MG tablet Take 1 tablet by mouth daily.     Polyethyl Glycol-Propyl Glycol 0.4-0.3 % SOLN Place 1 drop into the left eye as needed.     rosuvastatin (CRESTOR) 40 MG tablet Take 40 mg by mouth at bedtime.  3   valACYclovir (VALTREX) 1000 MG tablet Take 2,000  mg by mouth as needed (fever blister).     No current facility-administered medications for this visit.    Allergies:   Other    Social History:  The  patient  reports that he has never smoked. He has never used smokeless tobacco. He reports current alcohol use.   Family History:  The patient's family history includes Diabetes in his father, paternal uncle, and another family member; Hyperlipidemia in his brother; Hypertension in his mother.    ROS:    Reviewed and current history, otherwise review of systems is negative.  Physical Exam: Blood pressure 130/62, pulse 85, height '5\' 7"'$  (1.702 m), weight 168 lb 3.2 oz (76.3 kg), SpO2 98 %.       GEN:  Well nourished, well developed in no acute distress HEENT: Normal NECK: No JVD; No carotid bruits LYMPHATICS: No lymphadenopathy CARDIAC: RRR , no murmurs, rubs, gallops RESPIRATORY:  Clear to auscultation without rales, wheezing or rhonchi  ABDOMEN: Soft, non-tender, non-distended MUSCULOSKELETAL:  No edema; No deformity  SKIN: Warm and dry NEUROLOGIC:  Alert and oriented x 3  EKG:      April 10, 2022: Normal sinus rhythm at 85.  Normal EKG.  Recent Labs: No results found for requested labs within last 365 days.    Lipid Panel    Component Value Date/Time   CHOL 146 04/05/2021 0926   TRIG 84 04/05/2021 0926   HDL 57 04/05/2021 0926   CHOLHDL 2.6 04/05/2021 0926   CHOLHDL 2.8 10/24/2020 0730   VLDL 27 10/24/2020 0730   LDLCALC 73 04/05/2021 0926      Wt Readings from Last 3 Encounters:  04/10/22 168 lb 3.2 oz (76.3 kg)  05/09/21 165 lb 6.4 oz (75 kg)  05/07/21 161 lb (73 kg)      Other studies Reviewed: Additional studies/ records that were reviewed today include: . Review of the above records demonstrates:    ASSESSMENT AND PLAN:   Chest pain :     2. Hyperlipidemia -lipids overall look good.  His last LDL is 94.  We briefly discussed the possibility of doing a coronary calcium score and possibly lipoprotein a.  At present he is asymptomatic and is not inclined to do anything further.  We can discuss this again in a year.  4.   HTN-      BP is well  controlled.      Current medicines are reviewed at length with the patient today.  The patient does not have concerns regarding medicines.  The following changes have been made:  no change  Labs/ tests ordered today include:   Orders Placed This Encounter  Procedures   EKG 12-Lead    Follow up in 1 year with me or APP     Mertie Moores, MD  04/10/2022 [facility] [place], [place], [place]  [address] Phone: [phone]; Fax: [phone]

## 2022-04-10 ENCOUNTER — Ambulatory Visit: Payer: Medicare Other | Attending: Cardiovascular Disease | Admitting: Cardiovascular Disease

## 2022-04-10 ENCOUNTER — Encounter: Payer: Self-pay | Admitting: Cardiovascular Disease

## 2022-04-10 VITALS — BP 130/62 | HR 85 | Ht 67.0 in | Wt 168.2 lb

## 2022-04-10 DIAGNOSIS — I10 Essential (primary) hypertension: Secondary | ICD-10-CM | POA: Insufficient documentation

## 2022-04-10 DIAGNOSIS — E782 Mixed hyperlipidemia: Secondary | ICD-10-CM | POA: Insufficient documentation

## 2022-04-10 NOTE — Patient Instructions (Signed)
Medication Instructions:  Your physician recommends that you continue on your current medications as directed. Please refer to the Current Medication list given to you today.  *If you need a refill on your cardiac medications before your next appointment, please call your pharmacy*  Follow-Up: At [facility], you and your health needs are our priority.  As part of our continuing mission to provide you with exceptional heart care, we have created designated Provider Care Teams.  These Care Teams include your primary Cardiologist (physician) and Advanced Practice Providers (APPs -  Physician Assistants and Nurse Practitioners) who all work together to provide you with the care you need, when you need it.  Your next appointment:   1 year(s)  Provider:   Philip Nahser, MD     

## 2022-04-11 ENCOUNTER — Encounter: Payer: Self-pay | Admitting: Cardiovascular Disease

## 2022-04-11 DIAGNOSIS — E782 Mixed hyperlipidemia: Secondary | ICD-10-CM

## 2022-04-16 NOTE — Telephone Encounter (Signed)
Per OV note w/Nahser on 04/10/22:  2. Hyperlipidemia -lipids overall look good.  His last LDL is 94.  We briefly discussed the possibility of doing a coronary calcium score and possibly lipoprotein a.  At present he is asymptomatic and is not inclined to do anything further.  We can discuss this again in a year.   Order for Coronary calcium score CT placed at this time. Lipoprotein (a) also ordered. Patient will let us know when he can come by for the lab to be completed.

## 2022-05-08 ENCOUNTER — Ambulatory Visit: Payer: Medicare Other | Attending: Cardiovascular Disease

## 2022-05-08 DIAGNOSIS — E782 Mixed hyperlipidemia: Secondary | ICD-10-CM

## 2022-05-10 LAB — LIPOPROTEIN A (LPA): Lipoprotein (a): 33.8 nmol/L (ref <75.0)

## 2022-05-22 ENCOUNTER — Ambulatory Visit (HOSPITAL_BASED_OUTPATIENT_CLINIC_OR_DEPARTMENT_OTHER)
Admission: RE | Admit: 2022-05-22 | Discharge: 2022-05-22 | Disposition: A | Discharge status: Home / Self Care | Payer: BLUE CROSS/BLUE SHIELD | Source: Ambulatory Visit | Attending: Cardiovascular Disease | Admitting: Cardiovascular Disease

## 2022-05-22 ENCOUNTER — Encounter (HOSPITAL_BASED_OUTPATIENT_CLINIC_OR_DEPARTMENT_OTHER): Payer: Self-pay

## 2022-05-22 DIAGNOSIS — E782 Mixed hyperlipidemia: Secondary | ICD-10-CM

## 2022-05-23 ENCOUNTER — Encounter: Payer: Self-pay | Admitting: Cardiovascular Disease

## 2022-05-24 ENCOUNTER — Telehealth: Payer: Self-pay

## 2022-05-24 DIAGNOSIS — E782 Mixed hyperlipidemia: Secondary | ICD-10-CM

## 2022-05-24 MED ORDER — EZETIMIBE 10 MG PO TABS: 10.0000 mg | ORAL_TABLET | Freq: Every day | ORAL | 3 refills | Status: DC

## 2022-05-24 NOTE — Telephone Encounter (Signed)
Spoke with patient about results, all questions answered. Medications and labs ordered. No further needs at this time.

## 2022-05-24 NOTE — Telephone Encounter (Signed)
-----   Message from Thayer Headings, MD sent at 05/22/2022 10:43 AM EDT ----- CAC score is 487 ( 71st percentile for age / sex matched controls )  His LDL is 32  LP(a) is 33.8 I would like for his LDL to be below 70 Lets add Zetia 10 mg a day  Continue low cholesterol diet, regular exercise Recheck lipids in 3 months

## 2022-05-24 NOTE — Telephone Encounter (Signed)
Patient wants a call back to discuss further questions regarding results.

## 2022-06-26 ENCOUNTER — Encounter (INDEPENDENT_AMBULATORY_CARE_PROVIDER_SITE_OTHER): Payer: Medicare Other | Admitting: Ophthalmology

## 2022-06-26 DIAGNOSIS — H35031 Hypertensive retinopathy, right eye: Secondary | ICD-10-CM

## 2022-06-26 DIAGNOSIS — Z7984 Long term (current) use of oral hypoglycemic drugs: Secondary | ICD-10-CM

## 2022-06-26 DIAGNOSIS — D3131 Benign neoplasm of right choroid: Secondary | ICD-10-CM

## 2022-06-26 DIAGNOSIS — H43811 Vitreous degeneration, right eye: Secondary | ICD-10-CM | POA: Diagnosis not present

## 2022-06-26 DIAGNOSIS — I10 Essential (primary) hypertension: Secondary | ICD-10-CM | POA: Diagnosis not present

## 2022-06-26 DIAGNOSIS — H348112 Central retinal vein occlusion, right eye, stable: Secondary | ICD-10-CM

## 2022-06-26 DIAGNOSIS — H33301 Unspecified retinal break, right eye: Secondary | ICD-10-CM

## 2022-06-26 DIAGNOSIS — E113291 Type 2 diabetes mellitus with mild nonproliferative diabetic retinopathy without macular edema, right eye: Secondary | ICD-10-CM | POA: Diagnosis not present

## 2022-07-17 DIAGNOSIS — E7800 Pure hypercholesterolemia, unspecified: Secondary | ICD-10-CM | POA: Diagnosis not present

## 2022-07-17 DIAGNOSIS — E785 Hyperlipidemia, unspecified: Secondary | ICD-10-CM | POA: Diagnosis not present

## 2022-07-17 DIAGNOSIS — E559 Vitamin D deficiency, unspecified: Secondary | ICD-10-CM | POA: Diagnosis not present

## 2022-07-17 DIAGNOSIS — E119 Type 2 diabetes mellitus without complications: Secondary | ICD-10-CM | POA: Diagnosis not present

## 2022-07-17 DIAGNOSIS — I10 Essential (primary) hypertension: Secondary | ICD-10-CM | POA: Diagnosis not present

## 2022-07-17 DIAGNOSIS — Z1212 Encounter for screening for malignant neoplasm of rectum: Secondary | ICD-10-CM | POA: Diagnosis not present

## 2022-07-24 DIAGNOSIS — E119 Type 2 diabetes mellitus without complications: Secondary | ICD-10-CM | POA: Diagnosis not present

## 2022-07-24 DIAGNOSIS — E559 Vitamin D deficiency, unspecified: Secondary | ICD-10-CM | POA: Diagnosis not present

## 2022-07-24 DIAGNOSIS — E7800 Pure hypercholesterolemia, unspecified: Secondary | ICD-10-CM | POA: Diagnosis not present

## 2022-07-24 DIAGNOSIS — I10 Essential (primary) hypertension: Secondary | ICD-10-CM | POA: Diagnosis not present

## 2022-07-24 DIAGNOSIS — Z0000 Encounter for general adult medical examination without abnormal findings: Secondary | ICD-10-CM | POA: Diagnosis not present

## 2022-07-24 DIAGNOSIS — M19049 Primary osteoarthritis, unspecified hand: Secondary | ICD-10-CM | POA: Diagnosis not present

## 2022-07-24 DIAGNOSIS — R82998 Other abnormal findings in urine: Secondary | ICD-10-CM | POA: Diagnosis not present

## 2022-07-24 DIAGNOSIS — G250 Essential tremor: Secondary | ICD-10-CM | POA: Diagnosis not present

## 2022-07-24 DIAGNOSIS — Z1339 Encounter for screening examination for other mental health and behavioral disorders: Secondary | ICD-10-CM | POA: Diagnosis not present

## 2022-07-24 DIAGNOSIS — H348112 Central retinal vein occlusion, right eye, stable: Secondary | ICD-10-CM | POA: Diagnosis not present

## 2022-07-24 DIAGNOSIS — E875 Hyperkalemia: Secondary | ICD-10-CM | POA: Diagnosis not present

## 2022-07-24 DIAGNOSIS — H35033 Hypertensive retinopathy, bilateral: Secondary | ICD-10-CM | POA: Diagnosis not present

## 2022-07-24 DIAGNOSIS — Z1331 Encounter for screening for depression: Secondary | ICD-10-CM | POA: Diagnosis not present

## 2022-07-24 LAB — LAB REPORT - SCANNED
Albumin, Urine POC: 4.8
Creatinine, POC: 83 mg/dL
EGFR: 90
Microalb Creat Ratio: 6

## 2022-07-30 ENCOUNTER — Encounter: Payer: Self-pay | Admitting: Cardiovascular Disease

## 2022-07-30 ENCOUNTER — Ambulatory Visit
Admission: EM | Admit: 2022-07-30 | Discharge: 2022-07-30 | Disposition: A | Discharge status: Home / Self Care | Payer: Medicare Other | Attending: Nurse Practitioner | Admitting: Nurse Practitioner

## 2022-07-30 ENCOUNTER — Ambulatory Visit (INDEPENDENT_AMBULATORY_CARE_PROVIDER_SITE_OTHER): Payer: Medicare Other

## 2022-07-30 DIAGNOSIS — M79645 Pain in left finger(s): Secondary | ICD-10-CM | POA: Diagnosis not present

## 2022-07-30 DIAGNOSIS — S61211A Laceration without foreign body of left index finger without damage to nail, initial encounter: Secondary | ICD-10-CM

## 2022-07-30 DIAGNOSIS — S61227A Laceration with foreign body of left little finger without damage to nail, initial encounter: Secondary | ICD-10-CM | POA: Diagnosis not present

## 2022-07-30 MED ORDER — CEPHALEXIN 500 MG PO CAPS: 500.0000 mg | ORAL_CAPSULE | Freq: Three times a day (TID) | ORAL | 0 refills | Status: AC

## 2022-07-30 NOTE — ED Triage Notes (Signed)
Pt presents to UC w/ c/o left hand 5th finger laceration which occurred 5 days ago. Hit finger on a piece of farm equipment. Tetanus vaccine last year.

## 2022-07-30 NOTE — Discharge Instructions (Addendum)
Keep your wounds clean and dry Start Keflex 3 times a day for 5 days to prevent infection Please follow-up with your PCP if your symptoms do not improve Please go to the ER if you develop any worsening symptoms

## 2022-07-30 NOTE — ED Provider Notes (Signed)
[facility]    CSN: [id number] Arrival date & time: 07/30/22  1551      History   Chief Complaint Chief Complaint  Patient presents with   Finger Injury    Cut - Entered by patient    HPI Anthony Brennan is a 72 y.o. male presents for evaluation of a finger injury.  Patient reports 5 days ago he accidentally caught his left fifth finger and some farm equipment.  When he reactively jerked his finger out he did sustain a laceration to the volar aspect of the distal left fifth finger and a small abrasion to the dorsum of the finger.  He cleaned it with peroxide and has been keeping it covered in bandage since that time.  He does endorse some swelling and mild bruising.  No drainage or warmth.  He does take a baby aspirin daily but otherwise no other blood thinning medications.  He is up-to-date on his tetanus.  No other concerns at this time.  HPI  Past Medical History:  Diagnosis Date   Chest pain    Recent episode of - stress Myoview study was normal   Diabetes mellitus    Hyperlipidemia    Hypertension    Hypertensive retinopathy    OU    Patient Active Problem List   Diagnosis Date Noted   Vision loss of right eye 10/24/2020   Diabetes mellitus type 2 in nonobese [facility]) 10/24/2020   Hypertension 09/27/2010   Hyperlipidemia 09/27/2010   Chest pain of uncertain etiology 09/27/2010    Past Surgical History:  Procedure Laterality Date   APPENDECTOMY  1970   CARDIOVASCULAR STRESS TEST  02-21-2010   EF 65%   CATARACT EXTRACTION Bilateral    EYE SURGERY     HERNIA REPAIR  72 years old   RETINAL DETACHMENT SURGERY Left    S/P SB, PPV       Home Medications    Prior to Admission medications   Medication Sig Start Date End Date Taking? Authorizing Provider  cephALEXin (KEFLEX) 500 MG capsule Take 1 capsule (500 mg total) by mouth 3 (three) times daily for 5 days. 07/30/22 08/04/22 Yes Radford Pax, NP  acetaminophen (TYLENOL) 325 MG tablet Take 650 mg by mouth  every 6 (six) hours as needed for moderate pain.    [provider]  albuterol (VENTOLIN HFA) 108 (90 Base) MCG/ACT inhaler Inhale 1-2 puffs into the lungs every 4 (four) hours as needed for shortness of breath or wheezing. 07/02/20   [provider]  aspirin 81 MG tablet Take 81 mg by mouth daily.      [provider]  Cholecalciferol (VITAMIN D PO) Take 1 capsule by mouth daily.     [provider]  Dulaglutide 0.75 MG/0.5ML SOPN Inject 0.75 mg into the skin every Monday.    [provider]  empagliflozin (JARDIANCE) 25 MG TABS tablet Take 25 mg by mouth daily.    [provider]  ezetimibe (ZETIA) 10 MG tablet Take 1 tablet (10 mg total) by mouth daily. 05/24/22   Nahser, Deloris Ping, MD  famotidine (PEPCID) 40 MG tablet Take 40 mg by mouth daily as needed. 10/25/20   [provider]  metFORMIN (GLUCOPHAGE-XR) 500 MG 24 hr tablet Take 2 tablets (1,000 mg total) by mouth 2 (two) times daily. 10/26/20   Ghimire, Werner Lean, MD  olmesartan (BENICAR) 40 MG tablet Take 40 mg by mouth daily. 10/18/20   [provider]  pioglitazone (ACTOS)  30 MG tablet Take 1 tablet by mouth daily. 07/25/19   [provider]  Polyethyl Glycol-Propyl Glycol 0.4-0.3 % SOLN Place 1 drop into the left eye as needed.    [provider]  rosuvastatin (CRESTOR) 40 MG tablet Take 40 mg by mouth at bedtime. 06/04/17   [provider]  valACYclovir (VALTREX) 1000 MG tablet Take 2,000 mg by mouth as needed (fever blister). 09/01/18   [provider]    Family History Family History  Problem Relation Age of Onset   Hypertension Mother    Diabetes Father    Hyperlipidemia Brother    Diabetes Paternal Uncle    Diabetes Other     Social History Social History   Tobacco Use   Smoking status: Never   Smokeless tobacco: Never  Substance Use Topics   Alcohol use: Yes    Comment: Only Socially     Allergies    Other   Review of Systems Review of Systems  Skin:        Left 5th finger injury     Physical Exam Triage Vital Signs ED Triage Vitals [07/30/22 1644]  Enc Vitals Group     BP (!) 166/77     Pulse Rate 82     Resp 16     Temp 99.2 F (37.3 C)     Temp Source Oral     SpO2 98 %     Weight      Height      Head Circumference      Peak Flow      Pain Score      Pain Loc      Pain Edu?      Excl. in GC?    No data found.  Updated Vital Signs BP (!) 166/77 (BP Location: Right Arm)   Pulse 82   Temp 99.2 F (37.3 C) (Oral)   Resp 16   SpO2 98%   Visual Acuity Right Eye Distance:   Left Eye Distance:   Bilateral Distance:    Right Eye Near:   Left Eye Near:    Bilateral Near:     Physical Exam Vitals and nursing note reviewed.  Constitutional:      General: He is not in acute distress.    Appearance: Normal appearance. He is not ill-appearing.  HENT:     Head: Normocephalic and atraumatic.  Eyes:     Pupils: Pupils are equal, round, and reactive to light.  Cardiovascular:     Rate and Rhythm: Normal rate.  Pulmonary:     Effort: Pulmonary effort is normal.  Musculoskeletal:       Hands:  Skin:    General: Skin is warm and dry.  Neurological:     General: No focal deficit present.     Mental Status: He is alert and oriented to person, place, and time.  Psychiatric:        Mood and Affect: Mood normal.        Behavior: Behavior normal.      UC Treatments / Results  Labs (all labs ordered are listed, but only abnormal results are displayed) Labs Reviewed - No data to display  EKG   Radiology DG Finger Little Left  Result Date: 07/30/2022 CLINICAL DATA:  Laceration of the small finger on farm equipment EXAM: LEFT FINGER(S) - 2+ VIEW COMPARISON:  None Available. FINDINGS: No acute fracture or acute bony findings. Cutaneous irregularity just distal to the crease of the distal  interphalangeal joint anteriorly compatible with laceration. No  foreign body observed. Chronic appearing calcification medial to the distal head of the proximal phalanx ring finger. IMPRESSION: 1. Laceration along the volar surface of the distal interphalangeal joint of the ring finger. No foreign body or fracture identified. Electronically Signed   By: Gaylyn Rong M.D.   On: 07/30/2022 17:47    Procedures Procedures (including critical care time)  Medications Ordered in UC Medications - No data to display  Initial Impression / Assessment and Plan / UC Course  I have reviewed the triage vital signs and the nursing notes.  Pertinent labs & imaging results that were available during my care of the patient were reviewed by me and considered in my medical decision making (see chart for details).     Reviewed exam and symptoms with patient.  X-ray negative Wound cleaned and dressed by nursing staff.  Patient has finger splint that he will apply as needed Will start Keflex 3 times a day for 5 days as wound is too old to be closed Signs and symptoms of infection and wound care reviewed with patient He is already up-to-date on his tetanus PCP follow-up if symptoms do not improve ER precautions reviewed Final Clinical Impressions(s) / UC Diagnoses   Final diagnoses:  Finger pain, left  Laceration of left index finger without foreign body without damage to nail, initial encounter     Discharge Instructions      Keep your wounds clean and dry Start Keflex 3 times a day for 5 days to prevent infection Please follow-up with your PCP if your symptoms do not improve Please go to the ER if you develop any worsening symptoms     ED Prescriptions     Medication Sig Dispense Auth. Provider   cephALEXin (KEFLEX) 500 MG capsule Take 1 capsule (500 mg total) by mouth 3 (three) times daily for 5 days. 15 capsule Radford Pax, NP      PDMP not reviewed this encounter.   Radford Pax, NP 07/30/22 [phone]

## 2022-08-26 ENCOUNTER — Ambulatory Visit: Payer: Medicare Other | Attending: Cardiovascular Disease

## 2022-08-26 DIAGNOSIS — E782 Mixed hyperlipidemia: Secondary | ICD-10-CM | POA: Diagnosis not present

## 2022-08-27 LAB — LIPID PANEL
Chol/HDL Ratio: 2 ratio (ref 0.0–5.0)
Cholesterol, Total: 115 mg/dL (ref 100–199)
HDL: 58 mg/dL (ref >39)
LDL Chol Calc (NIH): 39 mg/dL (ref 0–99)
Triglycerides: 96 mg/dL (ref 0–149)
VLDL Cholesterol Cal: 18 mg/dL (ref 5–40)

## 2022-08-28 ENCOUNTER — Other Ambulatory Visit: Payer: BLUE CROSS/BLUE SHIELD

## 2022-09-01 DIAGNOSIS — R42 Dizziness and giddiness: Secondary | ICD-10-CM | POA: Diagnosis not present

## 2022-09-08 DIAGNOSIS — H18422 Band keratopathy, left eye: Secondary | ICD-10-CM | POA: Diagnosis not present

## 2022-09-08 DIAGNOSIS — Z961 Presence of intraocular lens: Secondary | ICD-10-CM | POA: Diagnosis not present

## 2022-09-10 ENCOUNTER — Encounter (INDEPENDENT_AMBULATORY_CARE_PROVIDER_SITE_OTHER): Payer: Medicare Other | Admitting: Ophthalmology

## 2022-09-10 DIAGNOSIS — H35031 Hypertensive retinopathy, right eye: Secondary | ICD-10-CM | POA: Diagnosis not present

## 2022-09-10 DIAGNOSIS — I10 Essential (primary) hypertension: Secondary | ICD-10-CM | POA: Diagnosis not present

## 2022-09-10 DIAGNOSIS — H43811 Vitreous degeneration, right eye: Secondary | ICD-10-CM

## 2022-09-10 DIAGNOSIS — H33301 Unspecified retinal break, right eye: Secondary | ICD-10-CM

## 2022-09-10 DIAGNOSIS — H34811 Central retinal vein occlusion, right eye, with macular edema: Secondary | ICD-10-CM | POA: Diagnosis not present

## 2022-09-10 DIAGNOSIS — D3131 Benign neoplasm of right choroid: Secondary | ICD-10-CM

## 2022-10-16 ENCOUNTER — Encounter (INDEPENDENT_AMBULATORY_CARE_PROVIDER_SITE_OTHER): Payer: Medicare Other | Admitting: Ophthalmology

## 2022-11-06 DIAGNOSIS — Z23 Encounter for immunization: Secondary | ICD-10-CM | POA: Diagnosis not present

## 2023-01-01 ENCOUNTER — Encounter (INDEPENDENT_AMBULATORY_CARE_PROVIDER_SITE_OTHER): Payer: Medicare Other | Admitting: Ophthalmology

## 2023-01-01 DIAGNOSIS — H348112 Central retinal vein occlusion, right eye, stable: Secondary | ICD-10-CM

## 2023-01-01 DIAGNOSIS — H33301 Unspecified retinal break, right eye: Secondary | ICD-10-CM

## 2023-01-01 DIAGNOSIS — D3131 Benign neoplasm of right choroid: Secondary | ICD-10-CM

## 2023-01-01 DIAGNOSIS — I10 Essential (primary) hypertension: Secondary | ICD-10-CM | POA: Diagnosis not present

## 2023-01-01 DIAGNOSIS — H43811 Vitreous degeneration, right eye: Secondary | ICD-10-CM | POA: Diagnosis not present

## 2023-01-01 DIAGNOSIS — H35031 Hypertensive retinopathy, right eye: Secondary | ICD-10-CM

## 2023-01-23 DIAGNOSIS — G250 Essential tremor: Secondary | ICD-10-CM | POA: Diagnosis not present

## 2023-01-23 DIAGNOSIS — H35033 Hypertensive retinopathy, bilateral: Secondary | ICD-10-CM | POA: Diagnosis not present

## 2023-01-23 DIAGNOSIS — I10 Essential (primary) hypertension: Secondary | ICD-10-CM | POA: Diagnosis not present

## 2023-01-23 DIAGNOSIS — E119 Type 2 diabetes mellitus without complications: Secondary | ICD-10-CM | POA: Diagnosis not present

## 2023-01-23 DIAGNOSIS — H348112 Central retinal vein occlusion, right eye, stable: Secondary | ICD-10-CM | POA: Diagnosis not present

## 2023-01-23 DIAGNOSIS — M19049 Primary osteoarthritis, unspecified hand: Secondary | ICD-10-CM | POA: Diagnosis not present

## 2023-01-23 DIAGNOSIS — E7800 Pure hypercholesterolemia, unspecified: Secondary | ICD-10-CM | POA: Diagnosis not present

## 2023-01-23 DIAGNOSIS — E559 Vitamin D deficiency, unspecified: Secondary | ICD-10-CM | POA: Diagnosis not present

## 2023-02-11 DIAGNOSIS — D229 Melanocytic nevi, unspecified: Secondary | ICD-10-CM | POA: Diagnosis not present

## 2023-02-11 DIAGNOSIS — L570 Actinic keratosis: Secondary | ICD-10-CM | POA: Diagnosis not present

## 2023-02-11 DIAGNOSIS — L821 Other seborrheic keratosis: Secondary | ICD-10-CM | POA: Diagnosis not present

## 2023-02-11 DIAGNOSIS — Q825 Congenital non-neoplastic nevus: Secondary | ICD-10-CM | POA: Diagnosis not present

## 2023-02-11 DIAGNOSIS — L578 Other skin changes due to chronic exposure to nonionizing radiation: Secondary | ICD-10-CM | POA: Diagnosis not present

## 2023-02-11 DIAGNOSIS — L814 Other melanin hyperpigmentation: Secondary | ICD-10-CM | POA: Diagnosis not present

## 2023-02-24 DIAGNOSIS — H4311 Vitreous hemorrhage, right eye: Secondary | ICD-10-CM | POA: Diagnosis not present

## 2023-02-24 DIAGNOSIS — Z961 Presence of intraocular lens: Secondary | ICD-10-CM | POA: Diagnosis not present

## 2023-02-24 DIAGNOSIS — H18832 Recurrent erosion of cornea, left eye: Secondary | ICD-10-CM | POA: Diagnosis not present

## 2023-04-15 ENCOUNTER — Encounter: Payer: Self-pay | Admitting: Cardiovascular Disease

## 2023-04-15 NOTE — Progress Notes (Unsigned)
Cardiology Office Note   Date:  04/16/2023   ID:  Anthony Brennan, DOB 10-13-1950, MRN [id number]  PCP:  Anthony Garbe, MD  Cardiologist:   Anthony Miss, MD   Chief Complaint  Patient presents with   Hyperlipidemia   Hypertension         Problem List:   1. HTN 2. Hyperlipidemia 3. DM     Anthony Brennan is a middle age gentleman with a hx of HTN and hyperlipidemia, DM.  He has done well .  June 02, 2012:  Anthony Brennan is doing well.  No CP or dyspnea.  Several dizzy spells yesterday - one episode occurred while he was sitting at his desk .  He has just started walking again this spring.     Jul 27, 2014 : Anthony Brennan is a 73 y.o. male who presents for his HTN. Walking regularly.  No CP or dyspnea   Jul 19, 2015:  Doing well.    BP is regular at home .  August 14, 2017:  Anthony Brennan is seen back today for follow-up visit.  He has a history of hypertension and hyperlipidemia.  He was last seen 2 years ago. Has had some bruising  Exercising some.    Still working in the [employer].   Had labs drawn at [facility]. Glucose was mildly elevated at 112.  Creatinine is normal at 0.8.  Sodium and potassium are normal.  Liver enzymes are normal.  White blood cell count is normal.  Apoprotein B is normal.  PSA was 1.634.  HDL is 51.  LDL 76.  Total cholesterol is 145.  Triglyceride level is 88.  August 04, 2019:  Anthony Brennan is seen today for follow-up visit for his hypertension and hyperlipidemia.  He was seen approximately 2 years ago. He brought labs with him from his primary MD  CHol is 136 Trigs = 91 HDL = 46 LDL = 72  Doing well No cp.  No dyspnea.   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   BP is a bit elevated here today .  BP is usually 120's   jUne, 22, 2022  Anthony Brennan is seen today for follow up of his HTN, HLD  Had some chest pain while at the beach last year Was eating ( too fast he said)  Severe CP , pressure  Took several deep breaths,  drank water  He thought  some food was stuck in his esophagus.  [facility] Brennan ( wife)  asked him to go to the hospital - he did not  Did see GI ( [facility])  Barium swallow showed  small hiatal hernia  Is scheduled for EGD .  Last Anthony Brennan was 11 years   Is retired  Research officer, political party 2 days a week - encouraged more walking  Still works on his farm .   Labs from his primary medical doctor in April, 2022 reveals a total cholesterol of 168 Triglyceride level is 78 HDL is 70 LDL is 82.  Nov. 2, 2022; Anthony Brennan is seen today for follow up visit Anthony Brennan was hospitalized on Aug. 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  Will have him see the lipid clinic for consideration of additionad therapy ( zetia vs PCSK 9 , vs Inclisaran )   His primary MD changed his Altace to Olmesartan 40 QD   Still eats bacon and sausage - encouraged him to avoid these .  Still gets some exercise  Encouraged him to walk more   Feb. 2, 2023 Anthony Brennan is seen today for follow up of his HTN and HLD  We started HCTZ 25 a day Took for about a month.  His BP looked good initially but he then developed presyncope.   Even with the HCTZ 12.5 daily he had difficulities.  His BP became too low.  He stopped the  HCTZ and felt much better  BP is typically in the 120-130 range.   No cp , no dyspnea, no further episodes of dizziness Going to the PREP classes at [facility]  Is enjoying the classes    Feb. 7, 2024 Anthony Brennan is seen today for follow up of his HTN, HLD Enjoying retirement  Staying active.   Anthony Brennan out to his farm [name])   Labs from his primary medical doctor from May, 2023 reveal a total cholesterol of 169 Triglyceride level is 99 HDL is 55 LDL  is 94 On rosuvastatin 40 mg   We discussed coronary calcium scoring and LP(a) testing  He is not inclined to do that at this time .   Father died at age 63s    04-23-2023 Anthony Brennan is seen for follow up of his HTN, HLD  No CP   Labs from April/May reveal a total cholesterol of 106 Triglyceride level is 84 HDL is 48 LDL is 41  He had a coronary calcium score performed March, 2024.  His coronary calcium score is 487 which is the 71st percentile for age/sex matched controls.       Past Medical History:  Diagnosis Date   Chest pain    Recent episode of - stress Myoview study was normal   Diabetes mellitus    Hyperlipidemia    Hypertension    Hypertensive retinopathy    OU    Past Surgical History:  Procedure Laterality Date   APPENDECTOMY  1970   CARDIOVASCULAR STRESS TEST  02-21-2010   EF 65%   CATARACT EXTRACTION Bilateral    EYE SURGERY     HERNIA REPAIR  73 years old   RETINAL DETACHMENT SURGERY Left    S/P SB, PPV     Current Outpatient Medications  Medication Sig Dispense Refill   acetaminophen (TYLENOL) 325 MG tablet Take 650 mg by mouth every 6 (six) hours as needed for moderate pain.     albuterol (VENTOLIN HFA) 108 (90 Base) MCG/ACT inhaler Inhale 1-2 puffs into the lungs every 4 (four) hours as needed for shortness of breath or wheezing.     aspirin 81 MG tablet Take 81 mg by mouth daily.       Cholecalciferol (VITAMIN D PO) Take 1 capsule by mouth daily.      Dulaglutide 0.75 MG/0.5ML SOPN Inject 0.75 mg into the skin every Monday.     empagliflozin (JARDIANCE) 25 MG TABS tablet Take 25 mg by mouth daily.     ezetimibe (ZETIA) 10 MG tablet Take 1 tablet (10 mg total) by mouth daily. 90 tablet 3   famotidine (PEPCID) 40 MG tablet Take 40 mg by mouth daily as needed.     metFORMIN (GLUCOPHAGE-XR) 500 MG  24 hr tablet Take 2 tablets (1,000 mg total) by mouth 2 (two) times daily.     olmesartan (BENICAR) 40 MG tablet Take 40 mg by mouth daily.     ONETOUCH  VERIO test strip 1 each daily.     pioglitazone (ACTOS) 30 MG tablet Take 1 tablet by mouth daily.     Polyethyl Glycol-Propyl Glycol 0.4-0.3 % SOLN Place 1 drop into the left eye as needed.     rosuvastatin (CRESTOR) 40 MG tablet Take 40 mg by mouth at bedtime.  3   valACYclovir (VALTREX) 1000 MG tablet Take 2,000 mg by mouth as needed (fever blister).     No current facility-administered medications for this visit.    Allergies:   Other    Social History:  The patient  reports that he has never smoked. He has never used smokeless tobacco. He reports current alcohol use.   Family History:  The patient's family history includes Diabetes in his father, paternal uncle, and another family member; Hyperlipidemia in his brother; Hypertension in his mother.    ROS:    Reviewed and current history, otherwise review of systems is negative.    Physical Exam: Blood pressure 130/62, pulse 85, height 5\' 7"  (1.702 m), weight 163 lb 6.4 oz (74.1 kg), SpO2 98%.       GEN:  Well nourished, well developed in no acute distress HEENT: Normal NECK: No JVD; No carotid bruits LYMPHATICS: No lymphadenopathy CARDIAC: RRR , no murmurs, rubs, gallops RESPIRATORY:  Clear to auscultation without rales, wheezing or rhonchi  ABDOMEN: Soft, non-tender, non-distended MUSCULOSKELETAL:  No edema; No deformity  SKIN: Warm and dry NEUROLOGIC:  Alert and oriented x 3  EKG:      EKG Interpretation Date/Time:  Wednesday April 16 2023 09:49:34 EST Ventricular Rate:  85 PR Interval:  168 QRS Duration:  84 QT Interval:  352 QTC Calculation: 418 R Axis:   -16  Text Interpretation: Normal sinus rhythm Minimal voltage criteria for LVH, may be normal variant ( R in aVL ) When compared with ECG of 23-Oct-2020 15:11, Questionable change in QRS axis Inverted T waves have replaced nonspecific T wave abnormality in Inferior leads Confirmed by Anthony Brennan ([id number]) on 04/16/2023 5:15:05 PM     Recent Labs: No  results found for requested labs within last 365 days.    Lipid Panel    Component Value Date/Time   CHOL 115 08/26/2022 0804   TRIG 96 08/26/2022 0804   HDL 58 08/26/2022 0804   CHOLHDL 2.0 08/26/2022 0804   CHOLHDL 2.8 10/24/2020 0730   VLDL 27 10/24/2020 0730   LDLCALC 39 08/26/2022 0804      Wt Readings from Last 3 Encounters:  04/16/23 163 lb 6.4 oz (74.1 kg)  04/10/22 168 lb 3.2 oz (76.3 kg)  05/09/21 165 lb 6.4 oz (75 kg)      Other studies Reviewed: Additional studies/ records that were reviewed today include: . Review of the above records demonstrates:    ASSESSMENT AND PLAN:   Chest pain :   no CP .  Had a negative myoview in 2022    2. Hyperlipidemia - LDL is 41  He has a MeSA cardiac score gives him a 12% risk of having a cardiac event over the next 10 years. His cardiac age is 38 years.  Operator showed him that we are doing everything we can to lower his risk.  He will increase his cardio exercise.   4.   HTN-  BP is well controlled.      Current medicines are reviewed at length with the patient today.  The patient does not have concerns regarding medicines.  The following changes have been made:  no change  Labs/ tests ordered today include:   Orders Placed This Encounter  Procedures   EKG 12-Lead    Follow up in 1 year     Anthony Miss, MD  04/16/2023 5:15 PM    [facility] [address] [place], [place], [place]  [address] Phone: [phone]; Fax: [phone]

## 2023-04-16 ENCOUNTER — Ambulatory Visit: Payer: Medicare Other | Attending: Cardiovascular Disease | Admitting: Cardiovascular Disease

## 2023-04-16 ENCOUNTER — Encounter: Payer: Self-pay | Admitting: Cardiovascular Disease

## 2023-04-16 VITALS — BP 130/62 | HR 85 | Ht 67.0 in | Wt 163.4 lb

## 2023-04-16 DIAGNOSIS — I10 Essential (primary) hypertension: Secondary | ICD-10-CM | POA: Diagnosis not present

## 2023-04-16 DIAGNOSIS — E782 Mixed hyperlipidemia: Secondary | ICD-10-CM | POA: Diagnosis not present

## 2023-04-16 NOTE — Patient Instructions (Signed)
Follow-Up: At [facility], you and your health needs are our priority.  As part of our continuing mission to provide you with exceptional heart care, we have created designated Provider Care Teams.  These Care Teams include your primary Cardiologist (physician) and Advanced Practice Providers (APPs -  Physician Assistants and Nurse Practitioners) who all work together to provide you with the care you need, when you need it.  We recommend signing up for the patient portal called "MyChart".  Sign up information is provided on this After Visit Summary.  MyChart is used to connect with patients for Virtual Visits (Telemedicine).  Patients are able to view lab/test results, encounter notes, upcoming appointments, etc.  Non-urgent messages can be sent to your provider as well.   To learn more about what you can do with MyChart, go to [email or web address].    Your next appointment:   1 year(s)  Provider:   Kristeen Miss, MD     Other Instructions  Using the Coronary Artery Calcium Score:  10 Year risk of a CHD Event: 12% Coronary Age:27  Difference from Chronologic Age (+3)

## 2023-05-11 ENCOUNTER — Other Ambulatory Visit: Payer: Self-pay | Admitting: Cardiovascular Disease

## 2023-05-11 DIAGNOSIS — E782 Mixed hyperlipidemia: Secondary | ICD-10-CM

## 2023-05-28 ENCOUNTER — Encounter (INDEPENDENT_AMBULATORY_CARE_PROVIDER_SITE_OTHER): Payer: Medicare Other | Admitting: Ophthalmology

## 2023-05-28 DIAGNOSIS — D3131 Benign neoplasm of right choroid: Secondary | ICD-10-CM

## 2023-05-28 DIAGNOSIS — H33301 Unspecified retinal break, right eye: Secondary | ICD-10-CM | POA: Diagnosis not present

## 2023-05-28 DIAGNOSIS — I10 Essential (primary) hypertension: Secondary | ICD-10-CM | POA: Diagnosis not present

## 2023-05-28 DIAGNOSIS — H348112 Central retinal vein occlusion, right eye, stable: Secondary | ICD-10-CM

## 2023-05-28 DIAGNOSIS — H35031 Hypertensive retinopathy, right eye: Secondary | ICD-10-CM

## 2023-08-11 DIAGNOSIS — I10 Essential (primary) hypertension: Secondary | ICD-10-CM | POA: Diagnosis not present

## 2023-08-11 DIAGNOSIS — E785 Hyperlipidemia, unspecified: Secondary | ICD-10-CM | POA: Diagnosis not present

## 2023-08-11 DIAGNOSIS — Z1212 Encounter for screening for malignant neoplasm of rectum: Secondary | ICD-10-CM | POA: Diagnosis not present

## 2023-08-11 DIAGNOSIS — E7800 Pure hypercholesterolemia, unspecified: Secondary | ICD-10-CM | POA: Diagnosis not present

## 2023-08-11 DIAGNOSIS — E119 Type 2 diabetes mellitus without complications: Secondary | ICD-10-CM | POA: Diagnosis not present

## 2023-08-11 DIAGNOSIS — Z125 Encounter for screening for malignant neoplasm of prostate: Secondary | ICD-10-CM | POA: Diagnosis not present

## 2023-08-11 DIAGNOSIS — E559 Vitamin D deficiency, unspecified: Secondary | ICD-10-CM | POA: Diagnosis not present

## 2023-08-13 DIAGNOSIS — I10 Essential (primary) hypertension: Secondary | ICD-10-CM | POA: Diagnosis not present

## 2023-08-13 DIAGNOSIS — E7800 Pure hypercholesterolemia, unspecified: Secondary | ICD-10-CM | POA: Diagnosis not present

## 2023-08-13 DIAGNOSIS — Z0000 Encounter for general adult medical examination without abnormal findings: Secondary | ICD-10-CM | POA: Diagnosis not present

## 2023-08-13 DIAGNOSIS — Z1331 Encounter for screening for depression: Secondary | ICD-10-CM | POA: Diagnosis not present

## 2023-08-13 DIAGNOSIS — E559 Vitamin D deficiency, unspecified: Secondary | ICD-10-CM | POA: Diagnosis not present

## 2023-08-13 DIAGNOSIS — M19049 Primary osteoarthritis, unspecified hand: Secondary | ICD-10-CM | POA: Diagnosis not present

## 2023-08-13 DIAGNOSIS — Z1339 Encounter for screening examination for other mental health and behavioral disorders: Secondary | ICD-10-CM | POA: Diagnosis not present

## 2023-08-13 DIAGNOSIS — G250 Essential tremor: Secondary | ICD-10-CM | POA: Diagnosis not present

## 2023-08-13 DIAGNOSIS — H35033 Hypertensive retinopathy, bilateral: Secondary | ICD-10-CM | POA: Diagnosis not present

## 2023-08-13 DIAGNOSIS — E119 Type 2 diabetes mellitus without complications: Secondary | ICD-10-CM | POA: Diagnosis not present

## 2023-08-13 DIAGNOSIS — H348112 Central retinal vein occlusion, right eye, stable: Secondary | ICD-10-CM | POA: Diagnosis not present

## 2023-08-13 DIAGNOSIS — R82998 Other abnormal findings in urine: Secondary | ICD-10-CM | POA: Diagnosis not present

## 2023-11-19 DIAGNOSIS — Z23 Encounter for immunization: Secondary | ICD-10-CM | POA: Diagnosis not present

## 2023-12-03 ENCOUNTER — Encounter (INDEPENDENT_AMBULATORY_CARE_PROVIDER_SITE_OTHER): Admitting: Ophthalmology

## 2023-12-03 DIAGNOSIS — I10 Essential (primary) hypertension: Secondary | ICD-10-CM | POA: Diagnosis not present

## 2023-12-03 DIAGNOSIS — H43811 Vitreous degeneration, right eye: Secondary | ICD-10-CM

## 2023-12-03 DIAGNOSIS — H33301 Unspecified retinal break, right eye: Secondary | ICD-10-CM

## 2023-12-03 DIAGNOSIS — H348112 Central retinal vein occlusion, right eye, stable: Secondary | ICD-10-CM

## 2023-12-03 DIAGNOSIS — D3131 Benign neoplasm of right choroid: Secondary | ICD-10-CM | POA: Diagnosis not present

## 2023-12-03 DIAGNOSIS — H35031 Hypertensive retinopathy, right eye: Secondary | ICD-10-CM | POA: Diagnosis not present

## 2023-12-23 DIAGNOSIS — J069 Acute upper respiratory infection, unspecified: Secondary | ICD-10-CM | POA: Diagnosis not present

## 2023-12-23 DIAGNOSIS — R051 Acute cough: Secondary | ICD-10-CM | POA: Diagnosis not present

## 2024-01-14 DIAGNOSIS — E119 Type 2 diabetes mellitus without complications: Secondary | ICD-10-CM | POA: Diagnosis not present

## 2024-01-14 DIAGNOSIS — I10 Essential (primary) hypertension: Secondary | ICD-10-CM | POA: Diagnosis not present

## 2024-01-14 DIAGNOSIS — E7800 Pure hypercholesterolemia, unspecified: Secondary | ICD-10-CM | POA: Diagnosis not present

## 2024-04-19 ENCOUNTER — Ambulatory Visit: Admitting: Cardiovascular Disease

## 2024-06-02 ENCOUNTER — Encounter (INDEPENDENT_AMBULATORY_CARE_PROVIDER_SITE_OTHER): Admitting: Ophthalmology
